# Patient Record
Sex: Male | Born: 1985 | Race: White | Hispanic: No | Marital: Single | Smoking: Never smoker
Health system: Southern US, Community
[De-identification: ages and names within clinical notes are randomized; demographics above are authoritative.]

## PROBLEM LIST (undated history)

## (undated) DIAGNOSIS — F909 Attention-deficit hyperactivity disorder, unspecified type: Secondary | ICD-10-CM

## (undated) DIAGNOSIS — F419 Anxiety disorder, unspecified: Secondary | ICD-10-CM

## (undated) HISTORY — DX: Anxiety disorder, unspecified: F41.9

---
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
## 2006-05-09 ENCOUNTER — Emergency Department (HOSPITAL_COMMUNITY): Admission: EM | Admit: 2006-05-09 | Discharge: 2006-05-09 | Payer: Self-pay | Admitting: Emergency Medicine

## 2014-06-10 ENCOUNTER — Ambulatory Visit (INDEPENDENT_AMBULATORY_CARE_PROVIDER_SITE_OTHER): Payer: BC Managed Care – PPO | Admitting: Family Medicine

## 2014-06-10 VITALS — BP 110/78 | HR 62 | Temp 98.2°F | Resp 16 | Ht 69.5 in | Wt 178.0 lb

## 2014-06-10 DIAGNOSIS — L237 Allergic contact dermatitis due to plants, except food: Secondary | ICD-10-CM

## 2014-06-10 DIAGNOSIS — L255 Unspecified contact dermatitis due to plants, except food: Secondary | ICD-10-CM

## 2014-06-10 MED ORDER — METHYLPREDNISOLONE ACETATE 80 MG/ML IJ SUSP
120.0000 mg | Freq: Once | INTRAMUSCULAR | Status: AC
  Administered 2014-06-10: 120 mg via INTRAMUSCULAR

## 2014-06-10 NOTE — Patient Instructions (Addendum)
Poison [employer] Poison ivy is a inflammation of the skin (contact dermatitis) caused by touching the allergens on the leaves of the ivy plant following previous exposure to the plant. The rash usually appears 48 hours after exposure. The rash is usually bumps (papules) or blisters (vesicles) in a linear pattern. Depending on your own sensitivity, the rash may simply cause redness and itching, or it may also progress to blisters which may break open. These must be well cared for to prevent secondary bacterial (germ) infection, followed by scarring. Keep any open areas dry, clean, dressed, and covered with an antibacterial ointment if needed. The eyes may also get puffy. The puffiness is worst in the morning and gets better as the day progresses. This dermatitis usually heals without scarring, within 2 to 3 weeks without treatment. HOME CARE INSTRUCTIONS  Thoroughly wash with soap and water as soon as you have been exposed to poison ivy. You have about one half hour to remove the plant resin before it will cause the rash. This washing will destroy the oil or antigen on the skin that is causing, or will cause, the rash. Be sure to wash under your fingernails as any plant resin there will continue to spread the rash. Do not rub skin vigorously when washing affected area. Poison ivy cannot spread if no oil from the plant remains on your body. A rash that has progressed to weeping sores will not spread the rash unless you have not washed thoroughly. It is also important to wash any clothes you have been wearing as these may carry active allergens. The rash will return if you wear the unwashed clothing, even several days later. Avoidance of the plant in the future is the best measure. Poison ivy plant can be recognized by the number of leaves. Generally, poison ivy has three leaves with flowering branches on a single stem. Diphenhydramine may be purchased over the counter and used as needed for itching. Do not drive with  this medication if it makes you drowsy.Ask your caregiver about medication for children. SEEK MEDICAL CARE IF:  Open sores develop.  Redness spreads beyond area of rash.  You notice purulent (pus-like) discharge.  You have increased pain.  Other signs of infection develop (such as fever). Document Released: 12/11/2000 Document Revised: 03/07/2012 Document Reviewed: 10/30/2009 [facility] Patient Information 2014 [employer], [place]. Poison [employer] [employer] Poison ivy is a inflammation of the skin (contact dermatitis) caused by touching the allergens on the leaves of the ivy plant following previous exposure to the plant. The rash usually appears 48 hours after exposure. The rash is usually bumps (papules) or blisters (vesicles) in a linear pattern. Depending on your own sensitivity, the rash may simply cause redness and itching, or it may also progress to blisters which may break open. These must be well cared for to prevent secondary bacterial (germ) infection, followed by scarring. Keep any open areas dry, clean, dressed, and covered with an antibacterial ointment if needed. The eyes may also get puffy. The puffiness is worst in the morning and gets better as the day progresses. This dermatitis usually heals without scarring, within 2 to 3 weeks without treatment. HOME CARE INSTRUCTIONS  Thoroughly wash with soap and water as soon as you have been exposed to poison ivy. You have about one half hour to remove the plant resin before it will cause the rash. This washing will destroy the oil or antigen on the skin that is causing, or will cause, the rash. Be sure to wash  under your fingernails as any plant resin there will continue to spread the rash. Do not rub skin vigorously when washing affected area. Poison ivy cannot spread if no oil from the plant remains on your body. A rash that has progressed to weeping sores will not spread the rash unless you have not washed thoroughly. It is also important to wash any  clothes you have been wearing as these may carry active allergens. The rash will return if you wear the unwashed clothing, even several days later. Avoidance of the plant in the future is the best measure. Poison ivy plant can be recognized by the number of leaves. Generally, poison ivy has three leaves with flowering branches on a single stem. Diphenhydramine may be purchased over the counter and used as needed for itching. Do not drive with this medication if it makes you drowsy.Ask your caregiver about medication for children. SEEK MEDICAL CARE IF:  Open sores develop.  Redness spreads beyond area of rash.  You notice purulent (pus-like) discharge.  You have increased pain.  Other signs of infection develop (such as fever). Document Released: 12/11/2000 Document Revised: 03/07/2012 Document Reviewed: 10/30/2009 [facility] Patient Information 2014 [place], [place].

## 2014-06-10 NOTE — Progress Notes (Signed)
° °  Subjective:    Patient ID: Mark Lucero, male    DOB: 11-14-1986, 28 y.o.   MRN: [id number]  Poison Ivy Pertinent negatives include no fever.    Chief Complaint  Patient presents with   Poison Ivy    X Wednesday, on arms   This chart was scribed for Elvina SidleKurt Lauenstein , MD by Andrew Auaven Small, ED Scribe. This patient was seen in room 10 and the patient's care was started at 10:07 AM.  HPI Comments: Mark Lucero is Lucero 28 y.o. male who presents to the [facility] complaining of poison ivy to bilateral arms x 3 days ago. Pt states he was outside cutting down pine trees 3 days ago. He reports he had used Lucero preventive prior to cutting the trees down. Pt reports poison ivy in the past in which he had Lucero shot.    There are no active problems to display for this patient.  Past Medical History  Diagnosis Date   Anxiety    No Known Allergies Prior to Admission medications   Medication Sig Start Date End Date Taking? Authorizing Provider  ALPRAZolam Prudy Feeler(XANAX) 1 MG tablet Take 1 mg by mouth at bedtime as needed for anxiety (Takes 1/2 tablet).   Yes Historical Provider, MD  lisdexamfetamine (VYVANSE) 50 MG capsule Take 50 mg by mouth daily.   Yes Historical Provider, MD   Review of Systems  Constitutional: Negative for fever and chills.  Genitourinary: Negative.   Skin: Positive for color change and rash.    Objective:   Physical Exam  Nursing note and vitals reviewed. Constitutional: He is oriented to person, place, and time. He appears well-developed and well-nourished. No distress.  HENT:  Head: Normocephalic and atraumatic.  Eyes: Conjunctivae and EOM are normal.  Neck: Normal range of motion. No tracheal deviation present.  Cardiovascular: Normal rate.   Pulmonary/Chest: Effort normal. No respiratory distress.  Musculoskeletal: Normal range of motion.  Neurological: He is alert and oriented to person, place, and time.  Skin: Skin is warm and dry. Rash noted.    Diffuse papular erythematous rash in streaks on both arms diffusely as well as small area of abdomen  Psychiatric: He has Lucero normal mood and affect. His behavior is normal.    Assessment & Plan:   1. Poison ivy    Meds ordered this encounter  Medications   methylPREDNISolone acetate (DEPO-MEDROL) injection 120 mg   Elvina SidleKurt Lauenstein

## 2017-12-23 ENCOUNTER — Emergency Department (HOSPITAL_COMMUNITY): Payer: Self-pay

## 2017-12-23 ENCOUNTER — Encounter (HOSPITAL_COMMUNITY): Payer: Self-pay | Admitting: *Deleted

## 2017-12-23 ENCOUNTER — Other Ambulatory Visit: Payer: Self-pay

## 2017-12-23 ENCOUNTER — Inpatient Hospital Stay (HOSPITAL_COMMUNITY)
Admission: EM | Admit: 2017-12-23 | Discharge: 2018-01-07 | DRG: 438 | Disposition: A | Discharge status: Home / Self Care | Payer: Self-pay | Attending: Internal Medicine | Admitting: Internal Medicine

## 2017-12-23 DIAGNOSIS — F1210 Cannabis abuse, uncomplicated: Secondary | ICD-10-CM | POA: Diagnosis present

## 2017-12-23 DIAGNOSIS — E8770 Fluid overload, unspecified: Secondary | ICD-10-CM | POA: Diagnosis not present

## 2017-12-23 DIAGNOSIS — D6489 Other specified anemias: Secondary | ICD-10-CM | POA: Diagnosis not present

## 2017-12-23 DIAGNOSIS — F909 Attention-deficit hyperactivity disorder, unspecified type: Secondary | ICD-10-CM | POA: Diagnosis present

## 2017-12-23 DIAGNOSIS — T79A0XA Compartment syndrome, unspecified, initial encounter: Secondary | ICD-10-CM

## 2017-12-23 DIAGNOSIS — S20219A Contusion of unspecified front wall of thorax, initial encounter: Secondary | ICD-10-CM | POA: Diagnosis present

## 2017-12-23 DIAGNOSIS — E876 Hypokalemia: Secondary | ICD-10-CM | POA: Diagnosis not present

## 2017-12-23 DIAGNOSIS — S3991XA Unspecified injury of abdomen, initial encounter: Secondary | ICD-10-CM | POA: Diagnosis present

## 2017-12-23 DIAGNOSIS — R740 Nonspecific elevation of levels of transaminase and lactic acid dehydrogenase [LDH]: Secondary | ICD-10-CM | POA: Diagnosis present

## 2017-12-23 DIAGNOSIS — R6511 Systemic inflammatory response syndrome (SIRS) of non-infectious origin with acute organ dysfunction: Secondary | ICD-10-CM | POA: Diagnosis not present

## 2017-12-23 DIAGNOSIS — K859 Acute pancreatitis without necrosis or infection, unspecified: Secondary | ICD-10-CM | POA: Diagnosis present

## 2017-12-23 DIAGNOSIS — Z4659 Encounter for fitting and adjustment of other gastrointestinal appliance and device: Secondary | ICD-10-CM

## 2017-12-23 DIAGNOSIS — T79A3XA Traumatic compartment syndrome of abdomen, initial encounter: Secondary | ICD-10-CM | POA: Diagnosis present

## 2017-12-23 DIAGNOSIS — T502X5A Adverse effect of carbonic-anhydrase inhibitors, benzothiadiazides and other diuretics, initial encounter: Secondary | ICD-10-CM | POA: Diagnosis not present

## 2017-12-23 DIAGNOSIS — E871 Hypo-osmolality and hyponatremia: Secondary | ICD-10-CM | POA: Diagnosis present

## 2017-12-23 DIAGNOSIS — J811 Chronic pulmonary edema: Secondary | ICD-10-CM | POA: Diagnosis not present

## 2017-12-23 DIAGNOSIS — F1010 Alcohol abuse, uncomplicated: Secondary | ICD-10-CM | POA: Diagnosis present

## 2017-12-23 DIAGNOSIS — I959 Hypotension, unspecified: Secondary | ICD-10-CM | POA: Diagnosis present

## 2017-12-23 DIAGNOSIS — W208XXA Other cause of strike by thrown, projected or falling object, initial encounter: Secondary | ICD-10-CM | POA: Diagnosis present

## 2017-12-23 DIAGNOSIS — N179 Acute kidney failure, unspecified: Secondary | ICD-10-CM

## 2017-12-23 DIAGNOSIS — J95851 Ventilator associated pneumonia: Secondary | ICD-10-CM | POA: Diagnosis not present

## 2017-12-23 DIAGNOSIS — Z781 Physical restraint status: Secondary | ICD-10-CM

## 2017-12-23 DIAGNOSIS — R739 Hyperglycemia, unspecified: Secondary | ICD-10-CM | POA: Diagnosis present

## 2017-12-23 DIAGNOSIS — R6510 Systemic inflammatory response syndrome (SIRS) of non-infectious origin without acute organ dysfunction: Secondary | ICD-10-CM

## 2017-12-23 DIAGNOSIS — K852 Alcohol induced acute pancreatitis without necrosis or infection: Principal | ICD-10-CM | POA: Diagnosis present

## 2017-12-23 DIAGNOSIS — G9341 Metabolic encephalopathy: Secondary | ICD-10-CM | POA: Diagnosis not present

## 2017-12-23 DIAGNOSIS — E872 Acidosis: Secondary | ICD-10-CM | POA: Diagnosis not present

## 2017-12-23 DIAGNOSIS — F10239 Alcohol dependence with withdrawal, unspecified: Secondary | ICD-10-CM | POA: Diagnosis not present

## 2017-12-23 DIAGNOSIS — R52 Pain, unspecified: Secondary | ICD-10-CM

## 2017-12-23 DIAGNOSIS — J9811 Atelectasis: Secondary | ICD-10-CM | POA: Diagnosis not present

## 2017-12-23 DIAGNOSIS — J9690 Respiratory failure, unspecified, unspecified whether with hypoxia or hypercapnia: Secondary | ICD-10-CM

## 2017-12-23 DIAGNOSIS — E162 Hypoglycemia, unspecified: Secondary | ICD-10-CM | POA: Diagnosis not present

## 2017-12-23 DIAGNOSIS — N200 Calculus of kidney: Secondary | ICD-10-CM | POA: Diagnosis present

## 2017-12-23 DIAGNOSIS — J9600 Acute respiratory failure, unspecified whether with hypoxia or hypercapnia: Secondary | ICD-10-CM

## 2017-12-23 DIAGNOSIS — J9601 Acute respiratory failure with hypoxia: Secondary | ICD-10-CM | POA: Diagnosis not present

## 2017-12-23 DIAGNOSIS — E781 Pure hyperglyceridemia: Secondary | ICD-10-CM | POA: Diagnosis present

## 2017-12-23 DIAGNOSIS — Z978 Presence of other specified devices: Secondary | ICD-10-CM

## 2017-12-23 DIAGNOSIS — F419 Anxiety disorder, unspecified: Secondary | ICD-10-CM | POA: Diagnosis present

## 2017-12-23 DIAGNOSIS — R0902 Hypoxemia: Secondary | ICD-10-CM

## 2017-12-23 HISTORY — DX: Attention-deficit hyperactivity disorder, unspecified type: F90.9

## 2017-12-23 LAB — CBC WITH DIFFERENTIAL/PLATELET
BASOS ABS: 0 10*3/uL (ref 0.0–0.1)
BASOS PCT: 0 %
EOS PCT: 0 %
Eosinophils Absolute: 0 10*3/uL (ref 0.0–0.7)
HEMATOCRIT: 43.2 % (ref 39.0–52.0)
HEMOGLOBIN: 15.9 g/dL (ref 13.0–17.0)
LYMPHS ABS: 1.3 10*3/uL (ref 0.7–4.0)
LYMPHS PCT: 7 %
MCH: 33.8 pg (ref 26.0–34.0)
MCHC: 36.8 g/dL — AB (ref 30.0–36.0)
MCV: 91.7 fL (ref 78.0–100.0)
MONOS PCT: 5 %
Monocytes Absolute: 1 10*3/uL (ref 0.1–1.0)
NEUTROS ABS: 16.9 10*3/uL — AB (ref 1.7–7.7)
Neutrophils Relative %: 88 %
Platelets: 291 10*3/uL (ref 150–400)
RBC: 4.71 MIL/uL (ref 4.22–5.81)
WBC: 19.2 10*3/uL — ABNORMAL HIGH (ref 4.0–10.5)

## 2017-12-23 LAB — COMPREHENSIVE METABOLIC PANEL
ALBUMIN: 3.8 g/dL (ref 3.5–5.0)
ALT: 55 U/L (ref 17–63)
ANION GAP: 12 (ref 5–15)
AST: 107 U/L — AB (ref 15–41)
Alkaline Phosphatase: 81 U/L (ref 38–126)
BILIRUBIN TOTAL: 1.1 mg/dL (ref 0.3–1.2)
BUN: 16 mg/dL (ref 6–20)
CHLORIDE: 98 mmol/L — AB (ref 101–111)
CO2: 21 mmol/L — AB (ref 22–32)
Calcium: 8 mg/dL — ABNORMAL LOW (ref 8.9–10.3)
Creatinine, Ser: 1.1 mg/dL (ref 0.61–1.24)
GFR calc Af Amer: >60 mL/min (ref >60)
GFR calc non Af Amer: >60 mL/min (ref >60)
GLUCOSE: 156 mg/dL — AB (ref 65–99)
POTASSIUM: 4.6 mmol/L (ref 3.5–5.1)
SODIUM: 131 mmol/L — AB (ref 135–145)
TOTAL PROTEIN: 6.6 g/dL (ref 6.5–8.1)

## 2017-12-23 LAB — I-STAT CG4 LACTIC ACID, ED
LACTIC ACID, VENOUS: 2.82 mmol/L — AB (ref 0.5–1.9)
Lactic Acid, Venous: 3.25 mmol/L (ref 0.5–1.9)

## 2017-12-23 LAB — I-STAT CHEM 8, ED
BUN: 18 mg/dL (ref 6–20)
CREATININE: 1.3 mg/dL — AB (ref 0.61–1.24)
Calcium, Ion: 0.97 mmol/L — ABNORMAL LOW (ref 1.15–1.40)
Chloride: 101 mmol/L (ref 101–111)
GLUCOSE: 160 mg/dL — AB (ref 65–99)
HEMATOCRIT: 49 % (ref 39.0–52.0)
HEMOGLOBIN: 16.7 g/dL (ref 13.0–17.0)
Potassium: 4.4 mmol/L (ref 3.5–5.1)
Sodium: 133 mmol/L — ABNORMAL LOW (ref 135–145)
TCO2: 26 mmol/L (ref 22–32)

## 2017-12-23 LAB — AMYLASE: Amylase: 659 U/L — ABNORMAL HIGH (ref 28–100)

## 2017-12-23 LAB — I-STAT TROPONIN, ED: TROPONIN I, POC: 0 ng/mL (ref 0.00–0.08)

## 2017-12-23 LAB — TYPE AND SCREEN
ABO/RH(D): O NEG
ANTIBODY SCREEN: NEGATIVE

## 2017-12-23 LAB — LIPASE, BLOOD: LIPASE: 1424 U/L — AB (ref 11–51)

## 2017-12-23 LAB — CBG MONITORING, ED: Glucose-Capillary: 128 mg/dL — ABNORMAL HIGH (ref 65–99)

## 2017-12-23 MED ORDER — FENTANYL CITRATE (PF) 100 MCG/2ML IJ SOLN
100.0000 ug | Freq: Once | INTRAMUSCULAR | Status: AC
  Administered 2017-12-23: 100 ug via INTRAVENOUS
  Filled 2017-12-23: qty 2

## 2017-12-23 MED ORDER — ACETAMINOPHEN 325 MG PO TABS
650.0000 mg | ORAL_TABLET | Freq: Four times a day (QID) | ORAL | Status: DC | PRN
Start: 2017-12-23 — End: 2017-12-26

## 2017-12-23 MED ORDER — ENOXAPARIN SODIUM 40 MG/0.4ML ~~LOC~~ SOLN
40.0000 mg | Freq: Every day | SUBCUTANEOUS | Status: DC
  Administered 2017-12-24 – 2017-12-29 (×7): 40 mg via SUBCUTANEOUS
  Filled 2017-12-23 (×7): qty 0.4

## 2017-12-23 MED ORDER — ONDANSETRON HCL 4 MG/2ML IJ SOLN
4.0000 mg | Freq: Four times a day (QID) | INTRAMUSCULAR | Status: DC | PRN
  Administered 2017-12-24: 4 mg via INTRAVENOUS
  Filled 2017-12-23 (×2): qty 2

## 2017-12-23 MED ORDER — PIPERACILLIN-TAZOBACTAM 3.375 G IVPB 30 MIN
3.3750 g | Freq: Once | INTRAVENOUS | Status: AC
  Administered 2017-12-23: 3.375 g via INTRAVENOUS
  Filled 2017-12-23: qty 50

## 2017-12-23 MED ORDER — IOPAMIDOL (ISOVUE-300) INJECTION 61%
INTRAVENOUS | Status: AC
  Filled 2017-12-23: qty 100

## 2017-12-23 MED ORDER — IOPAMIDOL (ISOVUE-300) INJECTION 61%
100.0000 mL | Freq: Once | INTRAVENOUS | Status: AC | PRN
  Administered 2017-12-23: 100 mL via INTRAVENOUS

## 2017-12-23 MED ORDER — SODIUM CHLORIDE 0.9 % IV BOLUS (SEPSIS)
1000.0000 mL | Freq: Once | INTRAVENOUS | Status: AC
  Administered 2017-12-23: 1000 mL via INTRAVENOUS

## 2017-12-23 MED ORDER — SODIUM CHLORIDE 0.9 % IV SOLN
INTRAVENOUS | Status: DC
  Administered 2017-12-23: 22:00:00 via INTRAVENOUS

## 2017-12-23 MED ORDER — MORPHINE SULFATE (PF) 4 MG/ML IV SOLN
2.0000 mg | INTRAVENOUS | Status: DC | PRN
  Administered 2017-12-24 (×3): 2 mg via INTRAVENOUS
  Filled 2017-12-23 (×3): qty 1

## 2017-12-23 MED ORDER — ONDANSETRON HCL 4 MG/2ML IJ SOLN
4.0000 mg | Freq: Once | INTRAMUSCULAR | Status: AC
  Administered 2017-12-23: 4 mg via INTRAVENOUS
  Filled 2017-12-23: qty 2

## 2017-12-23 MED ORDER — ACETAMINOPHEN 650 MG RE SUPP
650.0000 mg | Freq: Four times a day (QID) | RECTAL | Status: DC | PRN
  Administered 2017-12-25 – 2017-12-26 (×2): 650 mg via RECTAL
  Filled 2017-12-23 (×2): qty 1

## 2017-12-23 MED ORDER — ALPRAZOLAM 1 MG PO TABS
1.0000 mg | ORAL_TABLET | Freq: Every evening | ORAL | Status: DC | PRN
  Administered 2017-12-24: 1 mg via ORAL
  Filled 2017-12-23: qty 1

## 2017-12-23 MED ORDER — LACTATED RINGERS IV SOLN
INTRAVENOUS | Status: DC
  Administered 2017-12-24 – 2017-12-25 (×6): via INTRAVENOUS

## 2017-12-23 MED ORDER — ONDANSETRON HCL 4 MG PO TABS: 4.0000 mg | ORAL_TABLET | Freq: Four times a day (QID) | ORAL | Status: DC | PRN

## 2017-12-23 NOTE — ED Notes (Addendum)
ED TO INPATIENT HANDOFF REPORT  Name/Age/Gender Mark Lucero 31 y.o. male  Code Status Code Status History    This patient does not have a recorded code status. Please follow your organizational policy for patients in this situation.      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 ADHD   . Anxiety     Allergies No Known Allergies  IV Location/Drains/Wounds Patient Lines/Drains/Airways Status   Active Line/Drains/Airways    Name:   Placement date:   Placement time:   Site:   Days:   Peripheral IV 12/23/17 Left Antecubital   12/23/17    2255    Antecubital   less than 1          Labs/Imaging Results for orders placed or performed during the hospital encounter of 12/23/17 (from the past 48 hour(s))  Comprehensive metabolic panel     Status: Abnormal   Collection Time: 12/23/17  7:46 PM  Result Value Ref Range   Sodium 131 (L) 135 - 145 mmol/L   Potassium 4.6 3.5 - 5.1 mmol/L   Chloride 98 (L) 101 - 111 mmol/L   CO2 21 (L) 22 - 32 mmol/L   Glucose, Bld 156 (H) 65 - 99 mg/dL   BUN 16 6 - 20 mg/dL   Creatinine, Ser 1.10 0.61 - 1.24 mg/dL   Calcium 8.0 (L) 8.9 - 10.3 mg/dL   Total Protein 6.6 6.5 - 8.1 g/dL   Albumin 3.8 3.5 - 5.0 g/dL   AST 107 (H) 15 - 41 U/L   ALT 55 17 - 63 U/L   Alkaline Phosphatase 81 38 - 126 U/L   Total Bilirubin 1.1 0.3 - 1.2 mg/dL   GFR  calc non Af Amer >60 >60 mL/min   GFR calc Af Amer >60 >60 mL/min    Comment: (NOTE) The eGFR has been calculated using the CKD EPI equation. This calculation has not been validated in all clinical situations. eGFR's persistently <60 mL/min signify possible Chronic Kidney Disease.    Anion gap 12 5 - 15  CBC with Differential     Status: Abnormal   Collection Time: 12/23/17  7:46 PM  Result Value Ref Range   WBC 19.2 (H) 4.0 - 10.5 K/uL   RBC 4.71 4.22 - 5.81 MIL/uL   Hemoglobin 15.9 13.0 - 17.0 g/dL   HCT 43.2 39.0 - 52.0 %   MCV 91.7 78.0 - 100.0 fL   MCH 33.8 26.0 - 34.0 pg   MCHC 36.8 (H) 30.0 - 36.0 g/dL    Comment: CORRECTED FOR INTERFERING SUBSTANCE   RDW NOT CALCULATED 11.5 - 15.5 %   Platelets 291 150 - 400 K/uL   Neutrophils Relative % 88 %   Lymphocytes Relative 7 %   Monocytes Relative 5 %   Eosinophils Relative 0 %   Basophils Relative 0 %   Neutro Abs 16.9 (  H) 1.7 - 7.7 K/uL   Lymphs Abs 1.3 0.7 - 4.0 K/uL   Monocytes Absolute 1.0 0.1 - 1.0 K/uL   Eosinophils Absolute 0.0 0.0 - 0.7 K/uL   Basophils Absolute 0.0 0.0 - 0.1 K/uL   Smear Review MORPHOLOGY UNREMARKABLE   Lipase, blood     Status: Abnormal   Collection Time: 12/23/17  7:47 PM  Result Value Ref Range   Lipase 1,424 (H) 11 - 51 U/L    Comment: RESULTS CONFIRMED BY MANUAL DILUTION  Amylase     Status: Abnormal   Collection Time: 12/23/17  7:47 PM  Result Value Ref Range   Amylase 659 (H) 28 - 100 U/L  I-stat troponin, ED     Status: None   Collection Time: 12/23/17  7:51 PM  Result Value Ref Range   Troponin i, poc 0.00 0.00 - 0.08 ng/mL   Comment 3            Comment: Due to the release kinetics of cTnI, a negative result within the first hours of the onset of symptoms does not rule out myocardial infarction with certainty. If myocardial infarction is still suspected, repeat the test at appropriate intervals.   I-stat Chem 8, ED     Status: Abnormal   Collection Time: 12/23/17  7:52 PM   Result Value Ref Range   Sodium 133 (L) 135 - 145 mmol/L   Potassium 4.4 3.5 - 5.1 mmol/L   Chloride 101 101 - 111 mmol/L   BUN 18 6 - 20 mg/dL   Creatinine, Ser 1.30 (H) 0.61 - 1.24 mg/dL   Glucose, Bld 160 (H) 65 - 99 mg/dL   Calcium, Lucero 0.97 (L) 1.15 - 1.40 mmol/L   TCO2 26 22 - 32 mmol/L   Hemoglobin 16.7 13.0 - 17.0 g/dL   HCT 49.0 39.0 - 52.0 %  I-Stat CG4 Lactic Acid, ED     Status: Abnormal   Collection Time: 12/23/17  7:53 PM  Result Value Ref Range   Lactic Acid, Venous 3.25 (HH) 0.5 - 1.9 mmol/L   Comment NOTIFIED PHYSICIAN   CBG monitoring, ED     Status: Abnormal   Collection Time: 12/23/17  8:25 PM  Result Value Ref Range   Glucose-Capillary 128 (H) 65 - 99 mg/dL   Ct Abdomen Pelvis W Contrast  Result Date: 12/23/2017 CLINICAL DATA:  31 year old male with abdominal trauma. Nausea vomiting. EXAM: CT ABDOMEN AND PELVIS WITH CONTRAST TECHNIQUE: Multidetector CT imaging of the abdomen and pelvis was performed using the standard protocol following bolus administration of intravenous contrast. CONTRAST:  ISOVUE-300 IOPAMIDOL (ISOVUE-300) INJECTION 61% COMPARISON:  None. FINDINGS: Lower chest: Minimal bibasilar atelectatic changes. No intra-abdominal free air. Upper abdominal inflammatory changes and small amount of fluid. Hepatobiliary: No focal liver abnormality is seen. No gallstones, gallbladder wall thickening, or biliary dilatation. Pancreas: There is inflammatory changes of the pancreas with small amount of peripancreatic fluid. No loculated or drainable fluid collection/ abscess or pseudocyst. Spleen: Normal in size without focal abnormality. Adrenals/Urinary Tract: The adrenal glands are unremarkable. Multiple small nonobstructing bilateral renal calculi measure up to 5 mm in the inferior pole of the right kidney. There is no hydronephrosis on either side. The visualized ureters and urinary bladder appear unremarkable. Stomach/Bowel: Mild inflammatory changes of the  duodenum, likely reactive to inflammation of the pancreas. There is no bowel obstruction. The appendix is not visualized, likely surgically absent. Vascular/Lymphatic: No significant vascular findings are present. No enlarged abdominal or pelvic lymph nodes.  Reproductive: The prostate and seminal vesicles are grossly unremarkable. Other: None Musculoskeletal: No acute osseous pathology. There is expansile appearance of the right iliac bone with a mixed lytic and sclerotic or ground-glass area measuring 6.9 x 2.1 cm. There is no cortical breakage or obvious associated soft tissue. 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 Clinical correlation and direct comparison with prior images, if available recommended. If no prior images are available. This can be followed up with radiograph or MRI. IMPRESSION: 1. Acute pancreatitis.  No abscess. 2. Small nonobstructing bilateral renal calculi.  No hydronephrosis. 3. 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 Direct comparison with prior images, if available, or follow-up recommended. Electronically Signed   By: Anner Crete M.D.   On: 12/23/2017 20:24   Dg Chest Portable 1 View  Result Date: 12/23/2017 CLINICAL DATA:  Syncope. EXAM: PORTABLE CHEST 1 VIEW COMPARISON:  None. FINDINGS: The heart size and mediastinal contours are within normal limits. Both lungs are clear except for minimal linear atelectasis at the left lung base. Old healed fracture of the mid right clavicle. IMPRESSION: Minimal linear atelectasis at the left lung base. Electronically Signed   By: Lorriane Shire M.D.   On: 12/23/2017 19:56    Pending Labs Unresulted Labs (From admission, onward)   Start     Ordered   12/23/17 2003  Type and screen  STAT,   R     12/23/17 2002   12/23/17 1933  Urinalysis, Routine w reflex microscopic  Once,   STAT     12/23/17 1933       Vitals/Pain Today's Vitals   12/23/17 2130 12/23/17 2200 12/23/17 2213 12/23/17 2230  BP: 126/87 (!) 105/56  103/74  Pulse: 90 85  95  Resp: 18 (!) 22  (!) 24  Temp:      TempSrc:      SpO2: 100% 98%  95%  PainSc:   6      Isolation Precautions No active isolations  Medications Medications  0.9 %  sodium chloride infusion ( Intravenous New Bag/Given 12/23/17 2213)  sodium chloride 0.9 % bolus 1,000 mL (0 mLs Intravenous Stopped 12/23/17 2039)  sodium chloride 0.9 % bolus 1,000 mL (0 mLs Intravenous Stopped 12/23/17 2144)  piperacillin-tazobactam (ZOSYN) IVPB 3.375 g (0 g Intravenous Stopped 12/23/17 2139)  ondansetron (ZOFRAN) injection 4 mg (4 mg Intravenous Given 12/23/17 1958)  fentaNYL (SUBLIMAZE) injection 100 mcg (100 mcg Intravenous Given 12/23/17 1958)  iopamidol (ISOVUE-300) 61 % injection 100 mL (100 mLs Intravenous Contrast Given 12/23/17 2003)  fentaNYL (SUBLIMAZE) injection 100 mcg (100 mcg Intravenous Given 12/23/17 2138)    Mobility walks
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
## 2017-12-23 NOTE — ED Notes (Signed)
Patient transported to CT 

## 2017-12-23 NOTE — ED Notes (Signed)
X-ray at bedside

## 2017-12-23 NOTE — H&P (Signed)
History and Physical    Mark PollackGraham A Corter ZHY:[id number] DOB: 08-20-1986 DOA: 12/23/2017  PCP: Patient, No Pcp Per Consultants:  Madaline GuthrieSteiner - [facility] Patient coming from:  Home - lives alone; NOK: Dad, [phone]  Chief Complaint: Abdominal pain  HPI: Mark Lucero is a 31 y.o. male with medical history significant of anxiety presenting with abdominal pain.  He reports having chest and abdominal pain with pain that started the day after Christmas but really got bad today.  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  He has been drinking over Christmas, "too much probably."  He drank 2 bootleggers yesterday - 12 oz each.  He also drank a little bit of egg nog.  He drank about the same amount the day before.  He is prescribed Alprazolam and Adderall and also took Excedrin.  +marijuana on Christmas Day.   He went to urgent care today and got dizzy and passed out.  He is uncertain if he had chest compressions done.   ED Course:   New onset alcoholic pancreatitis.  Concern for traumatic abdominal injury but likely unrelated.  Syncope at urgent care, received chest compressions for a couple of seconds.  Seen by surgery - no free air, heavy drinking, likely medical issue rather than surgical.  Review of Systems: As per HPI; otherwise review of systems reviewed and negative.   Ambulatory Status:  Ambulates without assistance  Past Medical History:  Diagnosis Date  . ADHD   . Anxiety     History reviewed. No pertinent surgical history.  Social History   Socioeconomic History  . Marital status: Single    Spouse name: Not on file  . Number of children: Not on file  . Years of education: Not on file  . Highest education level: Not on file  Social Needs  . Financial resource strain: Not on file  . Food insecurity - worry: Not on file  . Food insecurity - inability: Not on file  . Transportation needs - medical: Not on file  .  Transportation needs - non-medical: Not on file  Occupational History  . Occupation: landscaper  Tobacco Use  . Smoking status: Never Smoker  . Smokeless tobacco: Never Used  Substance and Sexual Activity  . Alcohol use: Yes  . Drug use: Yes    Types: Marijuana  . Sexual activity: Not on file  Other Topics Concern  . Not on file  Social History Narrative  . Not on file    No Known Allergies  Family History  Problem Relation Age of Onset  . Drug abuse Sister   . Pancreatitis Neg Hx     Prior to Admission medications   Medication Sig Start Date End Date Taking? Authorizing Provider  ALPRAZolam Prudy Feeler(XANAX) 1 MG tablet Take 1 mg by mouth at bedtime as needed for anxiety.    Yes [provider]  amphetamine-dextroamphetamine (ADDERALL) 20 MG tablet Take 20 mg by mouth 4 (four) times daily as needed (adhd).  11/27/17  Yes [provider]  aspirin-acetaminophen-caffeine (EXCEDRIN MIGRAINE) [id number] MG tablet Take 1 tablet by mouth every 6 (six) hours as needed for headache or migraine.   Yes [provider]  ibuprofen (ADVIL,MOTRIN) 200 MG tablet Take 800 mg by mouth every 6 (six) hours as needed for moderate pain.   Yes [provider]  lisdexamfetamine (VYVANSE) 50 MG capsule Take 50 mg by mouth daily.  [provider]    Physical Exam: Vitals:   12/23/17 2130 12/23/17 2200 12/23/17 2230 12/23/17 2330  BP: 126/87 (!) 105/56 103/74 (!) 106/57  Pulse: 90 85 95 (!) 101  Resp: 18 (!) 22 (!) 24 20  Temp:    99.1 F (37.3 C)  TempSrc:    Oral  SpO2: 100% 98% 95% 95%     General:  Appears calm and comfortable and is NAD Eyes:  PERRL, EOMI, normal lids, iris ENT:  grossly normal hearing, lips & tongue, mmm Neck:  no LAD, masses or thyromegaly Cardiovascular:  RRR, no m/r/g. No LE edema.  Respiratory:   CTA bilaterally with no wheezes/rales/rhonchi.  Normal respiratory effort. Abdomen:  Taut, diffusely tender to palpation, no  ecchymoses Skin:  no rash or induration seen on limited exam Musculoskeletal:  grossly normal tone BUE/BLE, good ROM, no bony abnormality Psychiatric:  grossly normal mood and affect, speech fluent and appropriate, AOx3 Neurologic:  CN 2-12 grossly intact, moves all extremities in coordinated fashion, sensation intact    Radiological Exams on Admission: Ct Abdomen Pelvis W Contrast  Result Date: 12/23/2017 CLINICAL DATA:  31 year old male with abdominal trauma. Nausea vomiting. EXAM: CT ABDOMEN AND PELVIS WITH CONTRAST TECHNIQUE: Multidetector CT imaging of the abdomen and pelvis was performed using the standard protocol following bolus administration of intravenous contrast. CONTRAST:  ISOVUE-300 IOPAMIDOL (ISOVUE-300) INJECTION 61% COMPARISON:  None. FINDINGS: Lower chest: Minimal bibasilar atelectatic changes. No intra-abdominal free air. Upper abdominal inflammatory changes and small amount of fluid. Hepatobiliary: No focal liver abnormality is seen. No gallstones, gallbladder wall thickening, or biliary dilatation. Pancreas: There is inflammatory changes of the pancreas with small amount of peripancreatic fluid. No loculated or drainable fluid collection/ abscess or pseudocyst. Spleen: Normal in size without focal abnormality. Adrenals/Urinary Tract: The adrenal glands are unremarkable. Multiple small nonobstructing bilateral renal calculi measure up to 5 mm in the inferior pole of the right kidney. There is no hydronephrosis on either side. The visualized ureters and urinary bladder appear unremarkable. Stomach/Bowel: Mild inflammatory changes of the duodenum, likely reactive to inflammation of the pancreas. There is no bowel obstruction. The appendix is not visualized, likely surgically absent. Vascular/Lymphatic: No significant vascular findings are present. No enlarged abdominal or pelvic lymph nodes. Reproductive: The prostate and seminal vesicles are grossly unremarkable. Other: None  Musculoskeletal: No acute osseous pathology. There is expansile appearance of the right iliac bone with a mixed lytic and sclerotic or ground-glass area measuring 6.9 x 2.1 cm. There is no cortical breakage or obvious associated soft tissue. 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 Clinical correlation and direct comparison with prior images, if available recommended. If no prior images are available. This can be followed up with radiograph or MRI. IMPRESSION: 1. Acute pancreatitis.  No abscess. 2. Small nonobstructing bilateral renal calculi.  No hydronephrosis. 3. 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 Direct comparison with prior images, if available, or follow-up recommended. Electronically Signed   By: Elgie CollardArash  Radparvar M.D.   On: 12/23/2017 20:24   Dg Chest Portable 1 View  Result Date: 12/23/2017 CLINICAL DATA:  Syncope. EXAM: PORTABLE CHEST 1 VIEW COMPARISON:  None. FINDINGS: The heart size and mediastinal contours are within normal limits. Both lungs are clear except for minimal linear atelectasis at the left lung base. Old healed fracture of the mid right clavicle. IMPRESSION: Minimal linear atelectasis at the left  lung base. Electronically Signed   By: Francene Boyers M.D.   On: 12/23/2017 19:56    EKG: Independently reviewed.  NSR with rate 66; nonspecific ST changes, likely early repolarization abnormality   Labs on Admission: I have personally reviewed the available labs and imaging studies at the time of the admission.  Pertinent labs:   Glucose 128 Na++ 131 Glucose 160 BUN 16/Creatinine 1.10 Lactate 3.25, 2.82 AST 107/ALT 55 Amylase 659 Lipase 1424 Troponin 0.00 WBC 19.2  Assessment/Plan Principal Problem:   Alcoholic pancreatitis Active Problems:   ADHD   Anxiety   Hyperglycemia   Alcohol abuse   Marijuana abuse   Alcoholic  pancreatitis -Patient without prior h/o pancreatitis presenting with similar frank pancreatitis by H&P, elevated lipase, elevated LFTs, and abnormal CT -Incidental traumatic incident likely unrelated to current presentation -His LDH is pending, but assuming it is <300, his score is 0 with a mortality risk of 0.9%. -Will admit, as he is likely to remain in the hospital for several days to complete his evaluation and treatment and allow the pancreatitis time to improve -Strict NPO for now -Aggressive IVF hydration at least for the first 12 hours with LR at 250 cc/hr -Pain control with morphine 2 mg q2h prn.   -Nausea control with Zofran -Based on patient's report of recent heavy drinking, this is likely alcoholic pancreatitis -Surgery has been consulted and has already seen the patient; it is unlikely that he will need surgical intervention  ADHD -Hold medications - he will not be performing tasks that require attention and focus as an inpatient  Anxiety -prn qhs Xanax  Hyperglycemia -May be stress response -Will follow with fasting AM labs  Alcohol abuse -Patient reports recent increased use of ETOH without obvious significant heavy long-term use - but history is fairly vague -CAGE questions positive for cutting down and occasional eye opener -He is at risk for complications of withdrawal including seizures, DTs -Will observe -CIWA protocol held for now but if concerns develop will plan to add  Marijuana abuse -Cessation encouraged; this should be encouraged on an ongoing basis -UDS ordered   DVT prophylaxis: Lovenox Code Status:  Full - confirmed with patient/family Family Communication: Step-father present throughout evaluation  Disposition Plan:  Home once clinically improved Consults called: Surgery  Admission status: Admit - It is my clinical opinion that admission to INPATIENT is reasonable and necessary because this patient will require at least 2 midnights in the  hospital to treat this condition based on the medical complexity of the problems presented.  Given the aforementioned information, the predictability of an adverse outcome is felt to be significant.    Jonah Blue MD [facility]  If note is complete, please contact covering daytime or nighttime physician. [email or web address] Password [facility]  12/23/2017, 11:37 PM

## 2017-12-23 NOTE — Consult Note (Signed)
General Surgery [facility]  Reason for Consult: abdominal pain, blunt trauma to abdomen  Referring Physician: Dr. Sherwood Gambler, [facility] ER  Mark Lucero is an 31 y.o. male.  HPI: patient is a 31 yo WM who presents with one day history of abdominal pain, nausea, and emesis.  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  Pain however didn't develop until yesterday, following significant alcohol consumption on the day and evening prior.  In ER, WBC elevated at 19K.  Lactate level elevated at 3.25.  CTA obtained and demonstrates findings consistent with acute pancreatitis.  No evidence of any significant blunt trauma to the torso.  Patient has never had pancreatitis previously.  No prior hepatobiliary or pancreatic complaints.  No prior abdominal surgery.  Past Medical History:  Diagnosis Date  . Anxiety     History reviewed. No pertinent surgical history.  History reviewed. No pertinent family history.  Social History:  reports that  has never smoked. he has never used smokeless tobacco. He reports that he does not drink alcohol or use drugs.  Allergies: No Known Allergies  Medications: I have reviewed the patient's current medications.  Results for orders placed or performed during the hospital encounter of 12/23/17 (from the past 48 hour(s))  Comprehensive metabolic panel     Status: Abnormal   Collection Time: 12/23/17  7:46 PM  Result Value Ref Range   Sodium 131 (L) 135 - 145 mmol/L   Potassium 4.6 3.5 - 5.1 mmol/L   Chloride 98 (L) 101 - 111 mmol/L   CO2 21 (L) 22 - 32 mmol/L   Glucose, Bld 156 (H) 65 - 99 mg/dL   BUN 16 6 - 20 mg/dL   Creatinine, Ser 1.10 0.61 - 1.24 mg/dL   Calcium 8.0 (L) 8.9 - 10.3 mg/dL   Total Protein 6.6 6.5 - 8.1 g/dL   Albumin 3.8 3.5 - 5.0 g/dL   AST 107 (H) 15 - 41 U/L   ALT 55 17 - 63 U/L   Alkaline Phosphatase 81 38 - 126 U/L   Total Bilirubin  1.1 0.3 - 1.2 mg/dL   GFR calc non Af Amer >60 >60 mL/min   GFR calc Af Amer >60 >60 mL/min    Comment: (NOTE) The eGFR has been calculated using the CKD EPI equation. This calculation has not been validated in all clinical situations. eGFR's persistently <60 mL/min signify possible Chronic Kidney Disease.    Anion gap 12 5 - 15  CBC with Differential     Status: Abnormal   Collection Time: 12/23/17  7:46 PM  Result Value Ref Range   WBC 19.2 (H) 4.0 - 10.5 K/uL   RBC 4.71 4.22 - 5.81 MIL/uL   Hemoglobin 15.9 13.0 - 17.0 g/dL   HCT 43.2 39.0 - 52.0 %   MCV 91.7 78.0 - 100.0 fL   MCH 33.8 26.0 - 34.0 pg   MCHC 36.8 (H) 30.0 - 36.0 g/dL    Comment: CORRECTED FOR INTERFERING SUBSTANCE   RDW NOT CALCULATED 11.5 - 15.5 %   Platelets 291 150 - 400 K/uL   Neutrophils Relative % 88 %   Lymphocytes Relative 7 %   Monocytes Relative 5 %   Eosinophils Relative 0 %   Basophils Relative 0 %   Neutro Abs 16.9 (H) 1.7 - 7.7 K/uL   Lymphs Abs 1.3 0.7 - 4.0 K/uL   Monocytes  Absolute 1.0 0.1 - 1.0 K/uL   Eosinophils Absolute 0.0 0.0 - 0.7 K/uL   Basophils Absolute 0.0 0.0 - 0.1 K/uL   Smear Review MORPHOLOGY UNREMARKABLE   I-stat troponin, ED     Status: None   Collection Time: 12/23/17  7:51 PM  Result Value Ref Range   Troponin i, poc 0.00 0.00 - 0.08 ng/mL   Comment 3            Comment: Due to the release kinetics of cTnI, a negative result within the first hours of the onset of symptoms does not rule out myocardial infarction with certainty. If myocardial infarction is still suspected, repeat the test at appropriate intervals.   I-stat Chem 8, ED     Status: Abnormal   Collection Time: 12/23/17  7:52 PM  Result Value Ref Range   Sodium 133 (L) 135 - 145 mmol/L   Potassium 4.4 3.5 - 5.1 mmol/L   Chloride 101 101 - 111 mmol/L   BUN 18 6 - 20 mg/dL   Creatinine, Ser 1.30 (H) 0.61 - 1.24 mg/dL   Glucose, Bld 160 (H) 65 - 99 mg/dL   Calcium, Ion 0.97 (L) 1.15 - 1.40 mmol/L    TCO2 26 22 - 32 mmol/L   Hemoglobin 16.7 13.0 - 17.0 g/dL   HCT 49.0 39.0 - 52.0 %  I-Stat CG4 Lactic Acid, ED     Status: Abnormal   Collection Time: 12/23/17  7:53 PM  Result Value Ref Range   Lactic Acid, Venous 3.25 (HH) 0.5 - 1.9 mmol/L   Comment NOTIFIED PHYSICIAN   CBG monitoring, ED     Status: Abnormal   Collection Time: 12/23/17  8:25 PM  Result Value Ref Range   Glucose-Capillary 128 (H) 65 - 99 mg/dL    Ct Abdomen Pelvis W Contrast  Result Date: 12/23/2017 CLINICAL DATA:  31 year old male with abdominal trauma. Nausea vomiting. EXAM: CT ABDOMEN AND PELVIS WITH CONTRAST TECHNIQUE: Multidetector CT imaging of the abdomen and pelvis was performed using the standard protocol following bolus administration of intravenous contrast. CONTRAST:  ISOVUE-300 IOPAMIDOL (ISOVUE-300) INJECTION 61% COMPARISON:  None. FINDINGS: Lower chest: Minimal bibasilar atelectatic changes. No intra-abdominal free air. Upper abdominal inflammatory changes and small amount of fluid. Hepatobiliary: No focal liver abnormality is seen. No gallstones, gallbladder wall thickening, or biliary dilatation. Pancreas: There is inflammatory changes of the pancreas with small amount of peripancreatic fluid. No loculated or drainable fluid collection/ abscess or pseudocyst. Spleen: Normal in size without focal abnormality. Adrenals/Urinary Tract: The adrenal glands are unremarkable. Multiple small nonobstructing bilateral renal calculi measure up to 5 mm in the inferior pole of the right kidney. There is no hydronephrosis on either side. The visualized ureters and urinary bladder appear unremarkable. Stomach/Bowel: Mild inflammatory changes of the duodenum, likely reactive to inflammation of the pancreas. There is no bowel obstruction. The appendix is not visualized, likely surgically absent. Vascular/Lymphatic: No significant vascular findings are present. No enlarged abdominal or pelvic lymph nodes. Reproductive: The prostate  and seminal vesicles are grossly unremarkable. Other: None Musculoskeletal: No acute osseous pathology. There is expansile appearance of the right iliac bone with a mixed lytic and sclerotic or ground-glass area measuring 6.9 x 2.1 cm. There is no cortical breakage or obvious associated soft tissue. 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 Clinical correlation and direct comparison with prior images, if available recommended. If no prior images are available. This can be followed up with radiograph or MRI.  IMPRESSION: 1. Acute pancreatitis.  No abscess. 2. Small nonobstructing bilateral renal calculi.  No hydronephrosis. 3. 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 Direct comparison with prior images, if available, or follow-up recommended. Electronically Signed   By: Anner Crete M.D.   On: 12/23/2017 20:24   Dg Chest Portable 1 View  Result Date: 12/23/2017 CLINICAL DATA:  Syncope. EXAM: PORTABLE CHEST 1 VIEW COMPARISON:  None. FINDINGS: The heart size and mediastinal contours are within normal limits. Both lungs are clear except for minimal linear atelectasis at the left lung base. Old healed fracture of the mid right clavicle. IMPRESSION: Minimal linear atelectasis at the left lung base. Electronically Signed   By: Lorriane Shire M.D.   On: 12/23/2017 19:56    Review of Systems  Constitutional: Positive for diaphoresis. Negative for chills and fever.  HENT: Negative.   Eyes: Negative.   Respiratory: Negative.   Cardiovascular: Negative.   Gastrointestinal: Positive for abdominal pain, nausea and vomiting. Negative for constipation and diarrhea.  Genitourinary: Negative.   Skin: Negative.   Neurological: Positive for weakness.  Endo/Heme/Allergies: Negative.   Psychiatric/Behavioral: Negative.    Blood pressure 121/78, pulse 88, temperature 98.2 F (36.8 C),  temperature source Oral, resp. rate 17, SpO2 98 %. Physical Exam  Constitutional: He is oriented to person, place, and time. He appears well-developed and well-nourished. No distress.  HENT:  Head: Normocephalic and atraumatic.  Right Ear: External ear normal.  Left Ear: External ear normal.  Mouth/Throat: No oropharyngeal exudate.  Eyes: Conjunctivae are normal. Pupils are equal, round, and reactive to light. Right eye exhibits no discharge. Left eye exhibits no discharge. No scleral icterus.  Neck: Normal range of motion. Neck supple. No tracheal deviation present. No thyromegaly present.  Cardiovascular: Normal rate, regular rhythm and normal heart sounds.  Respiratory: Effort normal and breath sounds normal. No respiratory distress. He has no wheezes. He exhibits no tenderness.  GI: Bowel sounds are normal. He exhibits no distension and no mass. There is tenderness (diffuse upper abdomen). There is guarding. There is no rebound.  Musculoskeletal: Normal range of motion. He exhibits no edema, tenderness or deformity.  Lymphadenopathy:    He has no cervical adenopathy.  Neurological: He is alert and oriented to person, place, and time.  Skin: Skin is warm and dry. He is not diaphoretic.  Psychiatric: He has a normal mood and affect. His behavior is normal.    Assessment/Plan:  Acute pancreatitis, alcohol induced Blunt contusion to anterior chest wall and upper abdomen   Agree with admission to medical service for management of acute pancreatitis  No apparent complications of pancreatitis at this time - would follow up with repeat CT scan in 48-72 hours if no significant clinical improvement  No intervention necessary for blunt contusions  Will follow up tomorrow (Dr. Alphonsa Overall)  Armandina Gemma, [place] [facility]: [place] 12/23/2017, 8:53 PM

## 2017-12-23 NOTE — ED Notes (Signed)
ED Provider at bedside. 
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
## 2017-12-24 DIAGNOSIS — E871 Hypo-osmolality and hyponatremia: Secondary | ICD-10-CM | POA: Diagnosis present

## 2017-12-24 LAB — CBC
HEMATOCRIT: 44.9 % (ref 39.0–52.0)
Hemoglobin: 16.4 g/dL (ref 13.0–17.0)
MCH: 34.7 pg — AB (ref 26.0–34.0)
MCHC: 36.5 g/dL — ABNORMAL HIGH (ref 30.0–36.0)
MCV: 94.9 fL (ref 78.0–100.0)
Platelets: 199 10*3/uL (ref 150–400)
RBC: 4.73 MIL/uL (ref 4.22–5.81)
RDW: 12.4 % (ref 11.5–15.5)
WBC: 18.4 10*3/uL — AB (ref 4.0–10.5)

## 2017-12-24 LAB — COMPREHENSIVE METABOLIC PANEL
ALT: 48 U/L (ref 17–63)
AST: 89 U/L — AB (ref 15–41)
Albumin: 3.2 g/dL — ABNORMAL LOW (ref 3.5–5.0)
Alkaline Phosphatase: 63 U/L (ref 38–126)
Anion gap: 13 (ref 5–15)
BUN: 14 mg/dL (ref 6–20)
CHLORIDE: 100 mmol/L — AB (ref 101–111)
CO2: 17 mmol/L — AB (ref 22–32)
Calcium: 6.5 mg/dL — ABNORMAL LOW (ref 8.9–10.3)
Creatinine, Ser: 0.75 mg/dL (ref 0.61–1.24)
Glucose, Bld: 129 mg/dL — ABNORMAL HIGH (ref 65–99)
POTASSIUM: 4.3 mmol/L (ref 3.5–5.1)
SODIUM: 130 mmol/L — AB (ref 135–145)
Total Bilirubin: 1.1 mg/dL (ref 0.3–1.2)
Total Protein: 5.4 g/dL — ABNORMAL LOW (ref 6.5–8.1)

## 2017-12-24 LAB — HIV ANTIBODY (ROUTINE TESTING W REFLEX): HIV Screen 4th Generation wRfx: NONREACTIVE

## 2017-12-24 LAB — RAPID URINE DRUG SCREEN, HOSP PERFORMED
Amphetamines: NOT DETECTED
BARBITURATES: NOT DETECTED
BENZODIAZEPINES: POSITIVE — AB
COCAINE: NOT DETECTED
Opiates: POSITIVE — AB
TETRAHYDROCANNABINOL: NOT DETECTED

## 2017-12-24 LAB — ABO/RH: ABO/RH(D): O NEG

## 2017-12-24 LAB — LACTIC ACID, PLASMA
LACTIC ACID, VENOUS: 2.3 mmol/L — AB (ref 0.5–1.9)
LACTIC ACID, VENOUS: 3 mmol/L — AB (ref 0.5–1.9)
Lactic Acid, Venous: 3.2 mmol/L (ref 0.5–1.9)

## 2017-12-24 MED ORDER — ADULT MULTIVITAMIN W/MINERALS CH
1.0000 | ORAL_TABLET | Freq: Every day | ORAL | Status: DC
  Administered 2017-12-24 – 2017-12-25 (×2): 1 via ORAL
  Filled 2017-12-24 (×2): qty 1

## 2017-12-24 MED ORDER — SODIUM CHLORIDE 0.9 % IV BOLUS (SEPSIS)
1000.0000 mL | Freq: Once | INTRAVENOUS | Status: AC
  Administered 2017-12-24: 1000 mL via INTRAVENOUS

## 2017-12-24 MED ORDER — LORAZEPAM 2 MG/ML IJ SOLN: 1.0000 mg | Freq: Four times a day (QID) | INTRAMUSCULAR | Status: DC | PRN

## 2017-12-24 MED ORDER — FOLIC ACID 1 MG PO TABS
1.0000 mg | ORAL_TABLET | Freq: Every day | ORAL | Status: DC
  Administered 2017-12-24 – 2017-12-25 (×2): 1 mg via ORAL
  Filled 2017-12-24 (×2): qty 1

## 2017-12-24 MED ORDER — VITAMIN B-1 100 MG PO TABS
100.0000 mg | ORAL_TABLET | Freq: Every day | ORAL | Status: DC
  Administered 2017-12-24 – 2018-01-06 (×6): 100 mg via ORAL
  Filled 2017-12-24 (×7): qty 1

## 2017-12-24 MED ORDER — THIAMINE HCL 100 MG/ML IJ SOLN
100.0000 mg | Freq: Every day | INTRAMUSCULAR | Status: DC
  Administered 2017-12-26 – 2018-01-02 (×8): 100 mg via INTRAVENOUS
  Filled 2017-12-24 (×9): qty 2

## 2017-12-24 MED ORDER — HYDROMORPHONE HCL 1 MG/ML IJ SOLN
1.0000 mg | INTRAMUSCULAR | Status: DC | PRN
  Administered 2017-12-24 (×2): 1 mg via INTRAVENOUS
  Filled 2017-12-24 (×2): qty 1

## 2017-12-24 MED ORDER — HYDROMORPHONE HCL 1 MG/ML IJ SOLN
1.0000 mg | INTRAMUSCULAR | Status: DC | PRN
  Administered 2017-12-24 – 2017-12-25 (×9): 1 mg via INTRAVENOUS
  Filled 2017-12-24 (×9): qty 1

## 2017-12-24 MED ORDER — LORAZEPAM 1 MG PO TABS
1.0000 mg | ORAL_TABLET | Freq: Once | ORAL | Status: DC
  Filled 2017-12-24: qty 1

## 2017-12-24 MED ORDER — LORAZEPAM 1 MG PO TABS: 1.0000 mg | ORAL_TABLET | Freq: Four times a day (QID) | ORAL | Status: DC | PRN

## 2017-12-24 MED ORDER — INFLUENZA VAC SPLIT QUAD 0.5 ML IM SUSY
0.5000 mL | PREFILLED_SYRINGE | INTRAMUSCULAR | Status: DC
  Filled 2017-12-24: qty 0.5

## 2017-12-24 NOTE — Care Management Note (Signed)
Case Management Note  Patient Details  Name: Mark Lucero MRN: [id number] Date of Birth: 1986-05-06  Subjective/Objective:                  etoh abuse and pancreatitis  Action/Plan: Date: December 24, 2017 Marcelle SmilingRhonda Avonda Toso, BSN, [place]3, [place] [phone] Chart and notes review for patient progress and needs. Will follow for case management and discharge needs. Next review date: [id number]  Expected Discharge Date:                  Expected Discharge Plan:  Home/Self Care  In-House Referral:  Clinical Social Work  Discharge planning Services  CM Consult  Post Acute Care Choice:    Choice offered to:     DME Arranged:    DME Agency:     HH Arranged:    HH Agency:     Status of Service:  In process, will continue to follow  If discussed at Long Length of Stay Meetings, dates discussed:    Additional Comments:  Golda AcreDavis, Ersilia Brawley Lynn, RN 12/24/2017, 8:38 AM

## 2017-12-24 NOTE — Progress Notes (Addendum)
Patient states the morphine is not helping with his pain. Patient states that when morphine is administered he feels pain on his left side. Dr. Maryfrances Bunnellanford notified via text page.

## 2017-12-24 NOTE — Progress Notes (Signed)
CRITICAL VALUE ALERT  Critical Value:  Lactic acid 3.2  Date & Time Notied:  0245  Provider Notified: Craige CottaKirby  Orders Received/Actions taken: Yes

## 2017-12-24 NOTE — Progress Notes (Signed)
CRITICAL VALUE ALERT  Critical Value: lactic acid 3.0  Date & Time Notied:  1191478212272018  Provider Notified: Craige CottaKirby  Orders Received/Actions taken: Yes

## 2017-12-24 NOTE — Progress Notes (Signed)
Patient stated the Dilaudid is helping his pain but only last about 2 hours. Patient is requesting to have it changed to more frequent than q4hrs prn.  Dr. Maryfrances Bunnellanford notified via text page.

## 2017-12-24 NOTE — Progress Notes (Signed)
This shift received critical lab value x2 for lactic acid. 3.0 and 3.2. Attending notified with new orders for bolus x2 for normal saline. Lactic acid plasma to be drawn at 0600.Will continue to monitor.
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
## 2017-12-24 NOTE — Progress Notes (Signed)
PROGRESS NOTE    Mark Lucero  ZOX:[id number] DOB: 07-Sep-1986 DOA: 12/23/2017 PCP: Patient, No Pcp Per      Brief Narrative:  31 y.o. male with medical history significant of anxiety presenting with abdominal pain.  He reports having chest and abdominal pain with pain that started the day after Christmas but really got bad today.  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  He has been drinking over Christmas, "too much probably."  He drank 2 bootleggers yesterday - 12 oz each.  He also drank a little bit of egg nog.  He drank about the same amount the day before.  He is prescribed Alprazolam and Adderall and also took Excedrin.  +marijuana on Christmas Day.   He went to urgent care today and got dizzy and passed out.  He is uncertain if he had chest compressions done.     Assessment & Plan:  Principal Problem:   Alcoholic pancreatitis Active Problems:   ADHD   Anxiety   Hyperglycemia   Alcohol abuse   Marijuana abuse   Acute pancreatitis from alcohol Agree with Gen Surg, symptomatology all from alcoholic pancreatitis, trauma playing no role.  Ranson's low (glucose, WBC only) -N.p.o. And aggressive IVF -Dilaudid for pain, Zofran for nausea -Aggressive IV fluids -Repeat CMP and lactate in the morning, if worsening, repeat CT  -Complete cessation from alcohol recommended  Transaminitis No gallstones on CT.  Pattern also more consistent with alcohol injury.  Hyponatremia From Pancreatitis -Trend  Alcohol abuse Low likelihood of withdrawals -CIWA with thiamine, etc for now  Other medications -Hold Adderall and Xanax     DVT prophylaxis: Lovenox Code Status: FULL Family Communication: Partner at bedside Disposition Plan: Conservative mgmt of pancreatitis.  Likely will need several more days admission.   Consultants:   Gen Surg  Procedures:   None  Antimicrobials:   None    Subjective: Still  severe pain.  No appetite.  Nausea occasionally.  No new fever, confusion, weakness, oliguria.  Objective: Vitals:   12/23/17 2330 12/24/17 0435 12/24/17 1408 12/24/17 1511  BP: (!) 106/57 125/75 (!) 138/106   Pulse: (!) 101 92 (!) 112   Resp: 20 18 20    Temp: 99.1 F (37.3 C) 98.3 F (36.8 C) 98.8 F (37.1 C)   TempSrc: Oral Oral Oral   SpO2: 95% 96% 97%   Height:    5\' 11"  (1.803 m)    Intake/Output Summary (Last 24 hours) at 12/24/2017 1729 Last data filed at 12/24/2017 1400 Gross per 24 hour  Intake 5050 ml  Output -  Net 5050 ml   There were no vitals filed for this visit.  Examination: General appearance: Adult male, alert and in moderate distress from pain.   HEENT: Anicteric, conjunctiva pink, lids and lashes normal. No nasal deformity, discharge, epistaxis.  Lips moist.   Skin: Warm and dry.  No jaundice.  No suspicious rashes or lesions. Cardiac: Tachycardic but regular, nl S1-S2, no murmurs appreciated.  Capillary refill is brisk.  JVP normal.  No LE edema.  Radial pulses 2+ and symmetric. Respiratory: Normal respiratory rate and rhythm.  CTAB without rales or wheezes. Abdomen: Abdomen soft.  Very severe epigastric TTP with voluntary guarding. No ascites, distension, hepatosplenomegaly.   MSK: No deformities or effusions. Neuro: Awake and alert.  EOMI, moves all extremities. Speech fluent.    Psych: Sensorium intact and responding to questions, attention normal. Affect  normal.  Judgment and insight appear normal.    Data Reviewed: I have personally reviewed following labs and imaging studies:  CBC: Recent Labs  Lab 12/23/17 1946 12/23/17 1952 12/24/17 0220  WBC 19.2*  --  18.4*  NEUTROABS 16.9*  --   --   HGB 15.9 16.7 16.4  HCT 43.2 49.0 44.9  MCV 91.7  --  94.9  PLT 291  --  199   Basic Metabolic Panel: Recent Labs  Lab 12/23/17 1946 12/23/17 1952 12/24/17 0220  NA 131* 133* 130*  K 4.6 4.4 4.3  CL 98* 101 100*  CO2 21*  --  17*  GLUCOSE  156* 160* 129*  BUN 16 18 14   CREATININE 1.10 1.30* 0.75  CALCIUM 8.0*  --  6.5*   GFR: CrCl cannot be calculated (Unknown ideal weight.). Liver Function Tests: Recent Labs  Lab 12/23/17 1946 12/24/17 0220  AST 107* 89*  ALT 55 48  ALKPHOS 81 63  BILITOT 1.1 1.1  PROT 6.6 5.4*  ALBUMIN 3.8 3.2*   Recent Labs  Lab 12/23/17 1947  LIPASE 1,424*  AMYLASE 659*   No results for input(s): AMMONIA in the last 168 hours. Coagulation Profile: No results for input(s): INR, PROTIME in the last 168 hours. Cardiac Enzymes: No results for input(s): CKTOTAL, CKMB, CKMBINDEX, TROPONINI in the last 168 hours. BNP (last 3 results) No results for input(s): PROBNP in the last 8760 hours. HbA1C: No results for input(s): HGBA1C in the last 72 hours. CBG: Recent Labs  Lab 12/23/17 2025  GLUCAP 128*   Lipid Profile: No results for input(s): CHOL, HDL, LDLCALC, TRIG, CHOLHDL, LDLDIRECT in the last 72 hours. Thyroid Function Tests: No results for input(s): TSH, T4TOTAL, FREET4, T3FREE, THYROIDAB in the last 72 hours. Anemia Panel: No results for input(s): VITAMINB12, FOLATE, FERRITIN, TIBC, IRON, RETICCTPCT in the last 72 hours. Urine analysis: No results found for: COLORURINE, APPEARANCEUR, LABSPEC, PHURINE, GLUCOSEU, HGBUR, BILIRUBINUR, KETONESUR, PROTEINUR, UROBILINOGEN, NITRITE, LEUKOCYTESUR Sepsis Labs: @LABRCNTIP (procalcitonin:4,lacticacidven:4)  )No results found for this or any previous visit (from the past 240 hour(s)).       Radiology Studies: Ct Abdomen Pelvis W Contrast  Result Date: 12/23/2017 CLINICAL DATA:  31 year old male with abdominal trauma. Nausea vomiting. EXAM: CT ABDOMEN AND PELVIS WITH CONTRAST TECHNIQUE: Multidetector CT imaging of the abdomen and pelvis was performed using the standard protocol following bolus administration of intravenous contrast. CONTRAST:  ISOVUE-300 IOPAMIDOL (ISOVUE-300) INJECTION 61% COMPARISON:  None. FINDINGS: Lower chest: Minimal  bibasilar atelectatic changes. No intra-abdominal free air. Upper abdominal inflammatory changes and small amount of fluid. Hepatobiliary: No focal liver abnormality is seen. No gallstones, gallbladder wall thickening, or biliary dilatation. Pancreas: There is inflammatory changes of the pancreas with small amount of peripancreatic fluid. No loculated or drainable fluid collection/ abscess or pseudocyst. Spleen: Normal in size without focal abnormality. Adrenals/Urinary Tract: The adrenal glands are unremarkable. Multiple small nonobstructing bilateral renal calculi measure up to 5 mm in the inferior pole of the right kidney. There is no hydronephrosis on either side. The visualized ureters and urinary bladder appear unremarkable. Stomach/Bowel: Mild inflammatory changes of the duodenum, likely reactive to inflammation of the pancreas. There is no bowel obstruction. The appendix is not visualized, likely surgically absent. Vascular/Lymphatic: No significant vascular findings are present. No enlarged abdominal or pelvic lymph nodes. Reproductive: The prostate and seminal vesicles are grossly unremarkable. Other: None Musculoskeletal: No acute osseous pathology. There is expansile appearance of the right iliac bone with a mixed lytic and sclerotic  or ground-glass area measuring 6.9 x 2.1 cm. There is no cortical breakage or obvious associated soft tissue. 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 Clinical correlation and direct comparison with prior images, if available recommended. If no prior images are available. This can be followed up with radiograph or MRI. IMPRESSION: 1. Acute pancreatitis.  No abscess. 2. Small nonobstructing bilateral renal calculi.  No hydronephrosis. 3. 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 Direct comparison with prior images, if available, or follow-up  recommended. Electronically Signed   By: Elgie CollardArash  Radparvar M.D.   On: 12/23/2017 20:24   Dg Chest Portable 1 View  Result Date: 12/23/2017 CLINICAL DATA:  Syncope. EXAM: PORTABLE CHEST 1 VIEW COMPARISON:  None. FINDINGS: The heart size and mediastinal contours are within normal limits. Both lungs are clear except for minimal linear atelectasis at the left lung base. Old healed fracture of the mid right clavicle. IMPRESSION: Minimal linear atelectasis at the left lung base. Electronically Signed   By: Francene BoyersJames  Maxwell M.D.   On: 12/23/2017 19:56        Scheduled Meds: . enoxaparin (LOVENOX) injection  40 mg Subcutaneous QHS  . folic acid  1 mg Oral Daily  . [START ON 12/25/2017] Influenza vac split quadrivalent PF  0.5 mL Intramuscular Tomorrow-1000  . multivitamin with minerals  1 tablet Oral Daily  . thiamine  100 mg Oral Daily   Or  . thiamine  100 mg Intravenous Daily   Continuous Infusions: . lactated ringers 250 mL/hr at 12/24/17 1511     LOS: 1 day    Time spent: 30 minutes    Alberteen Samhristopher P Kjell Brannen, MD [facility] 12/24/2017, 5:29 PM     Pager [phone] --- please page though AMION:  [email or web address] If 7PM-7AM, please contact night-coverage

## 2017-12-25 ENCOUNTER — Inpatient Hospital Stay (HOSPITAL_COMMUNITY): Payer: Self-pay

## 2017-12-25 DIAGNOSIS — K852 Alcohol induced acute pancreatitis without necrosis or infection: Principal | ICD-10-CM

## 2017-12-25 DIAGNOSIS — N179 Acute kidney failure, unspecified: Secondary | ICD-10-CM

## 2017-12-25 DIAGNOSIS — F1010 Alcohol abuse, uncomplicated: Secondary | ICD-10-CM

## 2017-12-25 DIAGNOSIS — E871 Hypo-osmolality and hyponatremia: Secondary | ICD-10-CM

## 2017-12-25 DIAGNOSIS — T79A0XA Compartment syndrome, unspecified, initial encounter: Secondary | ICD-10-CM

## 2017-12-25 DIAGNOSIS — M79A3 Nontraumatic compartment syndrome of abdomen: Secondary | ICD-10-CM

## 2017-12-25 DIAGNOSIS — J9600 Acute respiratory failure, unspecified whether with hypoxia or hypercapnia: Secondary | ICD-10-CM

## 2017-12-25 DIAGNOSIS — J9601 Acute respiratory failure with hypoxia: Secondary | ICD-10-CM

## 2017-12-25 LAB — BLOOD GAS, ARTERIAL
ACID-BASE DEFICIT: 6 mmol/L — AB (ref 0.0–2.0)
Acid-base deficit: 5.9 mmol/L — ABNORMAL HIGH (ref 0.0–2.0)
BICARBONATE: 18.2 mmol/L — AB (ref 20.0–28.0)
Bicarbonate: 20.6 mmol/L (ref 20.0–28.0)
Drawn by: 257701
Drawn by: 257701
FIO2: 100
LHR: 18 {breaths}/min
O2 Saturation: 91.9 %
O2 Saturation: 96.2 %
PCO2 ART: 29.7 mmHg — AB (ref 32.0–48.0)
PEEP: 5 cmH2O
PO2 ART: 52.5 mmHg — AB (ref 83.0–108.0)
Patient temperature: 94
Patient temperature: 100.4
VT: 440 mL
pCO2 arterial: 48.2 mmHg — ABNORMAL HIGH (ref 32.0–48.0)
pH, Arterial: 7.389 (ref 7.350–7.450)
pH, Arterial: 7.261 — ABNORMAL LOW (ref 7.350–7.450)
pO2, Arterial: 96 mmHg (ref 83.0–108.0)

## 2017-12-25 LAB — MRSA PCR SCREENING: MRSA by PCR: NEGATIVE

## 2017-12-25 LAB — CBC WITH DIFFERENTIAL/PLATELET
BASOS ABS: 0 10*3/uL (ref 0.0–0.1)
Basophils Relative: 0 %
EOS PCT: 0 %
Eosinophils Absolute: 0 10*3/uL (ref 0.0–0.7)
HCT: 50.3 % (ref 39.0–52.0)
HEMOGLOBIN: 17.8 g/dL — AB (ref 13.0–17.0)
LYMPHS PCT: 9 %
Lymphs Abs: 1.1 10*3/uL (ref 0.7–4.0)
MCH: 33.5 pg (ref 26.0–34.0)
MCHC: 35.4 g/dL (ref 30.0–36.0)
MCV: 94.7 fL (ref 78.0–100.0)
Monocytes Absolute: 1.1 10*3/uL — ABNORMAL HIGH (ref 0.1–1.0)
Monocytes Relative: 9 %
NEUTROS PCT: 82 %
Neutro Abs: 9.5 10*3/uL — ABNORMAL HIGH (ref 1.7–7.7)
PLATELETS: 237 10*3/uL (ref 150–400)
RBC: 5.31 MIL/uL (ref 4.22–5.81)
RDW: 12.6 % (ref 11.5–15.5)
WBC: 11.6 10*3/uL — AB (ref 4.0–10.5)

## 2017-12-25 LAB — COMPREHENSIVE METABOLIC PANEL
ALBUMIN: 2.3 g/dL — AB (ref 3.5–5.0)
ALK PHOS: 66 U/L (ref 38–126)
ALT: 28 U/L (ref 17–63)
ALT: 22 U/L (ref 17–63)
ALT: 20 U/L (ref 17–63)
AST: 65 U/L — AB (ref 15–41)
AST: 56 U/L — AB (ref 15–41)
AST: 53 U/L — AB (ref 15–41)
Albumin: 2.6 g/dL — ABNORMAL LOW (ref 3.5–5.0)
Albumin: 2.4 g/dL — ABNORMAL LOW (ref 3.5–5.0)
Alkaline Phosphatase: 58 U/L (ref 38–126)
Alkaline Phosphatase: 52 U/L (ref 38–126)
Anion gap: 12 (ref 5–15)
Anion gap: 7 (ref 5–15)
Anion gap: 6 (ref 5–15)
BILIRUBIN TOTAL: 1.5 mg/dL — AB (ref 0.3–1.2)
BUN: 18 mg/dL (ref 6–20)
BUN: 20 mg/dL (ref 6–20)
BUN: 19 mg/dL (ref 6–20)
CALCIUM: 5.5 mg/dL — AB (ref 8.9–10.3)
CHLORIDE: 97 mmol/L — AB (ref 101–111)
CHLORIDE: 102 mmol/L (ref 101–111)
CHLORIDE: 101 mmol/L (ref 101–111)
CO2: 20 mmol/L — AB (ref 22–32)
CO2: 21 mmol/L — AB (ref 22–32)
CO2: 25 mmol/L (ref 22–32)
CREATININE: 1.71 mg/dL — AB (ref 0.61–1.24)
Calcium: 5.5 mg/dL — CL (ref 8.9–10.3)
Calcium: 6.6 mg/dL — ABNORMAL LOW (ref 8.9–10.3)
Creatinine, Ser: 1.2 mg/dL (ref 0.61–1.24)
Creatinine, Ser: 1.21 mg/dL (ref 0.61–1.24)
GFR calc Af Amer: 59 mL/min — ABNORMAL LOW (ref >60)
GFR calc Af Amer: >60 mL/min (ref >60)
GFR calc Af Amer: >60 mL/min (ref >60)
GFR calc non Af Amer: 51 mL/min — ABNORMAL LOW (ref >60)
GFR calc non Af Amer: >60 mL/min (ref >60)
GLUCOSE: 152 mg/dL — AB (ref 65–99)
GLUCOSE: 61 mg/dL — AB (ref 65–99)
Glucose, Bld: 165 mg/dL — ABNORMAL HIGH (ref 65–99)
POTASSIUM: 3.4 mmol/L — AB (ref 3.5–5.1)
Potassium: 4.1 mmol/L (ref 3.5–5.1)
Potassium: 4 mmol/L (ref 3.5–5.1)
SODIUM: 129 mmol/L — AB (ref 135–145)
SODIUM: 132 mmol/L — AB (ref 135–145)
Sodium: 130 mmol/L — ABNORMAL LOW (ref 135–145)
Total Bilirubin: 1.8 mg/dL — ABNORMAL HIGH (ref 0.3–1.2)
Total Bilirubin: 0.8 mg/dL (ref 0.3–1.2)
Total Protein: 5.6 g/dL — ABNORMAL LOW (ref 6.5–8.1)
Total Protein: 5.2 g/dL — ABNORMAL LOW (ref 6.5–8.1)
Total Protein: 5.1 g/dL — ABNORMAL LOW (ref 6.5–8.1)

## 2017-12-25 LAB — GLUCOSE, CAPILLARY
GLUCOSE-CAPILLARY: 141 mg/dL — AB (ref 65–99)
GLUCOSE-CAPILLARY: 68 mg/dL (ref 65–99)
Glucose-Capillary: 107 mg/dL — ABNORMAL HIGH (ref 65–99)
Glucose-Capillary: 129 mg/dL — ABNORMAL HIGH (ref 65–99)
Glucose-Capillary: 135 mg/dL — ABNORMAL HIGH (ref 65–99)
Glucose-Capillary: 45 mg/dL — ABNORMAL LOW (ref 65–99)
Glucose-Capillary: 55 mg/dL — ABNORMAL LOW (ref 65–99)

## 2017-12-25 LAB — TRIGLYCERIDES
TRIGLYCERIDES: 934 mg/dL — AB (ref <150)
TRIGLYCERIDES: 908 mg/dL — AB (ref <150)

## 2017-12-25 LAB — LIPASE, BLOOD: Lipase: 119 U/L — ABNORMAL HIGH (ref 11–51)

## 2017-12-25 LAB — LACTIC ACID, PLASMA: Lactic Acid, Venous: 3.6 mmol/L (ref 0.5–1.9)

## 2017-12-25 MED ORDER — PROPOFOL 1000 MG/100ML IV EMUL
INTRAVENOUS | Status: AC
  Filled 2017-12-25: qty 100

## 2017-12-25 MED ORDER — FENTANYL CITRATE (PF) 2500 MCG/50ML IJ SOLN
0.0000 ug/h | INTRAMUSCULAR | Status: DC
  Administered 2017-12-25: 25 ug/h via INTRAVENOUS
  Administered 2017-12-25: 375 ug/h via INTRAVENOUS
  Administered 2017-12-26: 400 ug/h via INTRAVENOUS
  Administered 2017-12-26: 325 ug/h via INTRAVENOUS
  Administered 2017-12-26: 350 ug/h via INTRAVENOUS
  Administered 2017-12-27 – 2017-12-28 (×5): 400 ug/h via INTRAVENOUS
  Filled 2017-12-25 (×11): qty 50

## 2017-12-25 MED ORDER — PANTOPRAZOLE SODIUM 40 MG IV SOLR
40.0000 mg | Freq: Two times a day (BID) | INTRAVENOUS | Status: DC
  Administered 2017-12-25 – 2017-12-29 (×9): 40 mg via INTRAVENOUS
  Filled 2017-12-25 (×9): qty 40

## 2017-12-25 MED ORDER — SODIUM CHLORIDE 0.9 % IV SOLN
4.0000 [IU]/h | INTRAVENOUS | Status: DC
  Administered 2017-12-25: 6 [IU]/h via INTRAVENOUS
  Filled 2017-12-25 (×4): qty 1

## 2017-12-25 MED ORDER — SODIUM CHLORIDE 0.9 % IV SOLN
0.5000 mg/h | INTRAVENOUS | Status: DC
  Administered 2017-12-25: 0.5 mg/h via INTRAVENOUS
  Filled 2017-12-25: qty 10

## 2017-12-25 MED ORDER — ETOMIDATE 2 MG/ML IV SOLN
20.0000 mg | Freq: Once | INTRAVENOUS | Status: AC
  Administered 2017-12-25: 20 mg via INTRAVENOUS

## 2017-12-25 MED ORDER — SODIUM CHLORIDE 0.9 % IV SOLN
1.0000 g | Freq: Once | INTRAVENOUS | Status: AC
  Administered 2017-12-25: 1 g via INTRAVENOUS
  Filled 2017-12-25: qty 10

## 2017-12-25 MED ORDER — ORAL CARE MOUTH RINSE
15.0000 mL | Freq: Four times a day (QID) | OROMUCOSAL | Status: DC
  Administered 2017-12-25 – 2018-01-02 (×29): 15 mL via OROMUCOSAL

## 2017-12-25 MED ORDER — MIDAZOLAM HCL 2 MG/2ML IJ SOLN
INTRAMUSCULAR | Status: AC
  Filled 2017-12-25: qty 4

## 2017-12-25 MED ORDER — SODIUM CHLORIDE 0.9 % IV BOLUS (SEPSIS)
1000.0000 mL | Freq: Once | INTRAVENOUS | Status: AC
  Administered 2017-12-25: 1000 mL via INTRAVENOUS

## 2017-12-25 MED ORDER — SODIUM CHLORIDE 0.9 % IV SOLN
1.0000 mg | Freq: Once | INTRAVENOUS | Status: AC
  Administered 2017-12-25: 1 mg via INTRAVENOUS
  Filled 2017-12-25: qty 0.2

## 2017-12-25 MED ORDER — PROPOFOL 1000 MG/100ML IV EMUL
5.0000 ug/kg/min | INTRAVENOUS | Status: DC
  Administered 2017-12-25: 5 ug/kg/min via INTRAVENOUS
  Administered 2017-12-25 – 2017-12-26 (×2): 25 ug/kg/min via INTRAVENOUS
  Filled 2017-12-25: qty 200

## 2017-12-25 MED ORDER — FENTANYL CITRATE (PF) 100 MCG/2ML IJ SOLN: 100.0000 ug | Freq: Once | INTRAMUSCULAR | Status: AC

## 2017-12-25 MED ORDER — SODIUM BICARBONATE 8.4 % IV SOLN
INTRAVENOUS | Status: DC
  Administered 2017-12-25 (×2): via INTRAVENOUS
  Filled 2017-12-25 (×4): qty 150

## 2017-12-25 MED ORDER — MIDAZOLAM HCL 2 MG/2ML IJ SOLN
4.0000 mg | Freq: Once | INTRAMUSCULAR | Status: AC
  Administered 2017-12-25: 4 mg via INTRAVENOUS

## 2017-12-25 MED ORDER — FENTANYL CITRATE (PF) 100 MCG/2ML IJ SOLN
200.0000 ug | Freq: Once | INTRAMUSCULAR | Status: AC
  Administered 2017-12-25: 200 ug via INTRAVENOUS

## 2017-12-25 MED ORDER — SODIUM BICARBONATE 8.4 % IV SOLN
INTRAVENOUS | Status: DC
Start: 2017-12-25 — End: 2017-12-25
  Administered 2017-12-25: 16:00:00 via INTRAVENOUS
  Filled 2017-12-25: qty 850

## 2017-12-25 MED ORDER — SODIUM CHLORIDE 0.9 % IV SOLN
2.0000 g | INTRAVENOUS | Status: DC | PRN
  Filled 2017-12-25 (×2): qty 20

## 2017-12-25 MED ORDER — CHLORHEXIDINE GLUCONATE 0.12% ORAL RINSE (MEDLINE KIT)
15.0000 mL | Freq: Two times a day (BID) | OROMUCOSAL | Status: DC
  Administered 2017-12-25 – 2018-01-03 (×17): 15 mL via OROMUCOSAL

## 2017-12-25 MED ORDER — SODIUM CHLORIDE 0.9 % IV SOLN
2.0000 mg/h | INTRAVENOUS | Status: DC
  Administered 2017-12-25: 10 mg/h via INTRAVENOUS
  Administered 2017-12-26: 7 mg/h via INTRAVENOUS
  Administered 2017-12-26: 14 mg/h via INTRAVENOUS
  Administered 2017-12-26: 8 mg/h via INTRAVENOUS
  Administered 2017-12-26: 15 mg/h via INTRAVENOUS
  Administered 2017-12-26: 11 mg/h via INTRAVENOUS
  Administered 2017-12-27: 15 mg/h via INTRAVENOUS
  Administered 2017-12-27: 14 mg/h via INTRAVENOUS
  Administered 2017-12-27 (×4): 15 mg/h via INTRAVENOUS
  Administered 2017-12-28: 11 mg/h via INTRAVENOUS
  Administered 2017-12-28: 15 mg/h via INTRAVENOUS
  Administered 2017-12-28: 11 mg/h via INTRAVENOUS
  Administered 2017-12-28: 13 mg/h via INTRAVENOUS
  Administered 2017-12-28: 15 mg/h via INTRAVENOUS
  Administered 2017-12-29 (×2): 10 mg/h via INTRAVENOUS
  Administered 2017-12-29 (×2): 9 mg/h via INTRAVENOUS
  Administered 2017-12-29: 12 mg/h via INTRAVENOUS
  Administered 2017-12-30: 9 mg/h via INTRAVENOUS
  Administered 2017-12-30: 8 mg/h via INTRAVENOUS
  Filled 2017-12-25 (×28): qty 10

## 2017-12-25 MED ORDER — FENTANYL BOLUS VIA INFUSION
50.0000 ug | INTRAVENOUS | Status: DC | PRN
  Administered 2017-12-26 – 2017-12-27 (×2): 50 ug via INTRAVENOUS
  Filled 2017-12-25: qty 50

## 2017-12-25 MED ORDER — MIDAZOLAM HCL 2 MG/2ML IJ SOLN
2.0000 mg | Freq: Once | INTRAMUSCULAR | Status: AC
  Administered 2017-12-25: 2 mg via INTRAVENOUS

## 2017-12-25 MED ORDER — SODIUM CHLORIDE 0.9 % IV SOLN
3.0000 ug/kg/min | INTRAVENOUS | Status: DC
  Administered 2017-12-25: 3 ug/kg/min via INTRAVENOUS
  Administered 2017-12-26: 3.5 ug/kg/min via INTRAVENOUS
  Administered 2017-12-26: 2.5 ug/kg/min via INTRAVENOUS
  Administered 2017-12-27: 3 ug/kg/min via INTRAVENOUS
  Filled 2017-12-25 (×5): qty 20

## 2017-12-25 MED ORDER — CISATRACURIUM BOLUS VIA INFUSION
5.0000 mg | Freq: Once | INTRAVENOUS | Status: DC
  Filled 2017-12-25: qty 5

## 2017-12-25 MED ORDER — DEXTROSE 50 % IV SOLN
INTRAVENOUS | Status: AC
  Administered 2017-12-26: 25 mL via INTRAVENOUS
  Filled 2017-12-25: qty 50

## 2017-12-25 MED ORDER — FENTANYL CITRATE (PF) 100 MCG/2ML IJ SOLN
INTRAMUSCULAR | Status: AC
  Filled 2017-12-25: qty 4

## 2017-12-25 MED ORDER — SODIUM CHLORIDE 0.9 % IV SOLN
2.0000 g | Freq: Once | INTRAVENOUS | Status: AC
  Administered 2017-12-25: 2 g via INTRAVENOUS
  Filled 2017-12-25: qty 20

## 2017-12-25 MED ORDER — MIDAZOLAM BOLUS VIA INFUSION
2.0000 mg | INTRAVENOUS | Status: DC | PRN
  Administered 2017-12-26 – 2017-12-29 (×9): 2 mg via INTRAVENOUS
  Filled 2017-12-25 (×2): qty 2

## 2017-12-25 MED ORDER — POTASSIUM CHLORIDE 10 MEQ/50ML IV SOLN
10.0000 meq | INTRAVENOUS | Status: AC
  Administered 2017-12-25 – 2017-12-26 (×4): 10 meq via INTRAVENOUS
  Filled 2017-12-25 (×4): qty 50

## 2017-12-25 MED ORDER — SODIUM CHLORIDE 0.9 % IV SOLN
1.0000 g | Freq: Three times a day (TID) | INTRAVENOUS | Status: DC
  Administered 2017-12-25 – 2018-01-03 (×27): 1 g via INTRAVENOUS
  Filled 2017-12-25 (×27): qty 1

## 2017-12-25 MED ORDER — ROCURONIUM BROMIDE 50 MG/5ML IV SOLN
50.0000 mg | Freq: Once | INTRAVENOUS | Status: AC
  Administered 2017-12-25: 50 mg via INTRAVENOUS
  Filled 2017-12-25: qty 5

## 2017-12-25 MED ORDER — ARTIFICIAL TEARS OPHTHALMIC OINT
1.0000 "application " | TOPICAL_OINTMENT | Freq: Three times a day (TID) | OPHTHALMIC | Status: DC
  Administered 2017-12-25 – 2017-12-27 (×7): 1 via OPHTHALMIC
  Filled 2017-12-25: qty 3.5

## 2017-12-25 MED ORDER — FENTANYL CITRATE (PF) 100 MCG/2ML IJ SOLN: 100.0000 ug | Freq: Once | INTRAMUSCULAR | Status: DC | PRN

## 2017-12-25 NOTE — Consult Note (Signed)
PULMONARY / CRITICAL CARE MEDICINE   Name: Mark Lucero MRN: [id number] DOB: 1986-08-03    ADMISSION DATE:  12/23/2017 CONSULTATION DATE: December 25, 2017  REFERRING MD: Hospitalist to staff  CHIEF COMPLAINT: Severe pancreatitis with abdominal pain severe abdominal distention  HISTORY OF PRESENT ILLNESS:   Patient is 31 years old Caucasian male with past medical history of ADHD and anxiety who has been binge drinking alcohol over the holidays presents with abdominal pain and was diagnosed with pancreatitis for the past 2 days patient course has gotten worse in the sense he has severe very progressive abdominal distention to the degree that it is interfering with his breathing he is nauseated he has dry heaves he is feeling severe abdominal pain by the time I saw him patient was having difficulty finishing sentences very anxious and severe abdominal pain with dry heaving girlfriend was in the room patient was denying any hallucinations and he is saying that the way his belly feels like a brick in his legs feel so heavy.  PAST MEDICAL HISTORY :  He  has a past medical history of ADHD and Anxiety.  PAST SURGICAL HISTORY: He  has no past surgical history on file.  No Known Allergies  No current facility-administered medications on file prior to encounter.    Current Outpatient Medications on File Prior to Encounter  Medication Sig  . ALPRAZolam (XANAX) 1 MG tablet Take 1 mg by mouth at bedtime as needed for anxiety.   Marland Kitchen. amphetamine-dextroamphetamine (ADDERALL) 20 MG tablet Take 20 mg by mouth 4 (four) times daily as needed (adhd).   Marland Kitchen. aspirin-acetaminophen-caffeine (EXCEDRIN MIGRAINE) 250-250-65 MG tablet Take 1 tablet by mouth every 6 (six) hours as needed for headache or migraine.  Marland Kitchen. ibuprofen (ADVIL,MOTRIN) 200 MG tablet Take 800 mg by mouth every 6 (six) hours as needed for moderate pain.    FAMILY HISTORY:  His indicated that his sister is alive. He indicated that the status of  his neg hx is unknown.   SOCIAL HISTORY: He  reports that  has never smoked. he has never used smokeless tobacco. He reports that he drinks alcohol. He reports that he uses drugs. Drug: Marijuana.  REVIEW OF SYSTEMS:   Parts mentioned HPI 14 points reviewed mentioned  SUBJECTIVE:  And severe abdominal pain dry heaving heavy legs  VITAL SIGNS: BP (!) 107/58   Pulse (!) 119   Temp (!) 100.8 F (38.2 C)   Resp 17   Ht 5\' 10"  (1.778 m)   Wt 209 lb 14.1 oz (95.2 kg)   SpO2 100%   BMI 30.11 kg/m   HEMODYNAMICS:    VENTILATOR SETTINGS: Vent Mode: PRVC FiO2 (%):  [100 %] 100 % Set Rate:  [18 bmp] 18 bmp Vt Set:  [440 mL] 440 mL PEEP:  [5 cmH20] 5 cmH20 Plateau Pressure:  [17 cmH20] 17 cmH20  INTAKE / OUTPUT: I/O last 3 completed shifts: In: 6050 [I.V.:4000; IV Piggyback:2050] Out: -   PHYSICAL EXAMINATION: General: In severe distress from shortness of breath abdominal pain and vomiting Neuro:  WNL , AOX3 , EOMI , CN II-XII intact , UL , LL strength is symmetrical and 5/5 HEENT:  atraumatic , no jaundice , dry mucous membranes  Cardiovascular:  Irregular irregular , ESM 2/6 in the aortic area  Lungs: Decreased air entry with minimal air entry at the bases Abdomen: Abdominal compartment syndrome no bowel sounds patient has severe distention no rebound tenderness and extremely tense abdominal wall. Musculoskeletal: Lower extremities  with extreme dusky color intact pulses no loss of sensation no numbness Skin:  No rash    LABS:  BMET Recent Labs  Lab 12/23/17 1946 12/23/17 1952 12/24/17 0220 12/25/17 0634  NA 131* 133* 130* 129*  K 4.6 4.4 4.3 4.1  CL 98* 101 100* 97*  CO2 21*  --  17* 20*  BUN 16 18 14 18   CREATININE 1.10 1.30* 0.75 1.71*  GLUCOSE 156* 160* 129* 165*    Electrolytes Recent Labs  Lab 12/23/17 1946 12/24/17 0220 12/25/17 0634  CALCIUM 8.0* 6.5* 5.5*    CBC Recent Labs  Lab 12/23/17 1946 12/23/17 1952 12/24/17 0220 12/25/17 0634   WBC 19.2*  --  18.4* 11.6*  HGB 15.9 16.7 16.4 17.8*  HCT 43.2 49.0 44.9 50.3  PLT 291  --  199 237    Coag's No results for input(s): APTT, INR in the last 168 hours.  Sepsis Markers Recent Labs  Lab 12/24/17 0220 12/24/17 0546 12/25/17 0634  LATICACIDVEN 3.2* 2.3* 3.6*    ABG Recent Labs  Lab 12/25/17 1140  PHART 7.389  PCO2ART 29.7*  PO2ART 52.5*    Liver Enzymes Recent Labs  Lab 12/23/17 1946 12/24/17 0220 12/25/17 0634  AST 107* 89* 65*  ALT 55 48 28  ALKPHOS 81 63 66  BILITOT 1.1 1.1 1.8*  ALBUMIN 3.8 3.2* 2.6*    Cardiac Enzymes No results for input(s): TROPONINI, PROBNP in the last 168 hours.  Glucose Recent Labs  Lab 12/23/17 2025  GLUCAP 128*    Imaging Dg Chest Port 1 View  Result Date: 12/25/2017 CLINICAL DATA:  Acute respiratory failure. On ventilator. Alcoholic pancreatitis. EXAM: PORTABLE CHEST 1 VIEW COMPARISON:  12/23/2017 FINDINGS: New left internal jugular central venous catheter is seen with tip overlying the proximal right atrium. Endotracheal tube and nasogastric tube are seen in appropriate position. No pneumothorax visualized. Low lung volumes are seen with bibasilar atelectasis. No evidence of pulmonary consolidation or edema. IMPRESSION: Support lines and tubes in appropriate position. No pneumothorax visualized. Low lung volumes with bibasilar atelectasis. Electronically Signed   By: Myles RosenthalJohn  Stahl M.D.   On: 12/25/2017 15:50       DISCUSSION: This is a 31 years old with severe decompensated pancreatitis alcoholic induced with abdominal compartment syndrome.  ASSESSMENT / PLAN:  PULMONARY A: Patient will be intubated paralyzed for abdominal compartment syndrome P:   That bundle CARDIOVASCULAR A:  Tachycardia with stable blood pressure P:  Observe  RENAL A:   Acute kidney injury P:   Aggressive resuscitation I feel the patient will end up getting worse given his abdominal compartment syndrome and if there is no  improvement is her creatinine will get nephrology involved and we will reinvolve the surgical team for celiotomy  GASTROINTESTINAL A:   N.p.o. abdominal compartment syndrome major bladder pressure 3 times a day intubated paralyzed P:   PPI twice a day  HEMATOLOGIC A:   Leukocytosis P:  We will start covering him with meropenem for pancreatic sepsis  INFECTIOUS A:   Leukocytosis with severe worsening pancreatitis will start covering him for peripancreatic sepsis P:   Repeat lipase in the morning  ENDOCRINE A:   No active issues P:   He might have a secondary diabetes from his acute severe fulminant pancreatitis  NEUROLOGIC A:   Patient will be some dated paralyzed intubated reimaging of his bladder pressure   On discussion with the family his mother and dad and the patient and his fiance his condition  is really critical and he is in very critical condition at this point I explained to the family that the patient will need to be medically treated if he has compartment syndrome that has not improved with endorgan damage with complete shock or acute kidney injury without improvement with high bladder pressures a patient needs to be taken to the OR for celiotomy at this point will try to manage this condition medically but I feel with the patient reports that he already took and the way his abdomen feels and bladder pressure of 25 with dusky color legs acute kidney injury he will end up having to go through relieving abdominal pressure mechanically by celiotomy.   Critical care time is 60 minutes    Pulmonary and Critical Care Medicine [facility] Pager: [phone]  12/25/2017, 4:08 PM

## 2017-12-25 NOTE — Progress Notes (Signed)
Patient abdomen distended & firm.  Positive bowel sounds in all 4 quadrants. HR 124.  Dr. Maryfrances Bunnellanford notified via text page.

## 2017-12-25 NOTE — Progress Notes (Signed)
Temperature corrected ABG on 12/25/17 at approximately 1147 on RA are as follows:  PH 7.39 PCO2 29.7 p02 52.5 cHco3 18.2   RN and RN Charge are aware of these corrected values.

## 2017-12-25 NOTE — Progress Notes (Signed)
CH introduced and offered comfort support to pt's SO. Please page if additional support is needed. Chaplain Elmarie Shileyamela Carrington Batch[place]Holder, [place]   12/25/17 1900  Clinical Encounter Type  Visited With Family

## 2017-12-25 NOTE — Progress Notes (Signed)
Alcoholic pancreatitis  Subjective: Pt's pain is better  Objective: Vital signs in last 24 hours: Temp:  [97 F (36.1 C)-98.8 F (37.1 C)] 97 F (36.1 C) (12/29 0622) Pulse Rate:  [112-125] 119 (12/29 0622) Resp:  [18-20] 18 (12/29 0622) BP: (107-138)/(66-106) 119/81 (12/29 0622) SpO2:  [94 %-98 %] 94 % (12/29 0622) Last BM Date: 12/24/17  Intake/Output from previous day: 12/28 0701 - 12/29 0700 In: 3000 [I.V.:3000] Out: -  Intake/Output this shift: No intake/output data recorded.  General appearance: alert and cooperative GI: normal findings: soft, abd tense, edematous  Lab Results:  Results for orders placed or performed during the hospital encounter of 12/23/17 (from the past 24 hour(s))  Urine rapid drug screen (hosp performed)     Status: Abnormal   Collection Time: 12/24/17  2:28 PM  Result Value Ref Range   Opiates POSITIVE (A) NONE DETECTED   Cocaine NONE DETECTED NONE DETECTED   Benzodiazepines POSITIVE (A) NONE DETECTED   Amphetamines NONE DETECTED NONE DETECTED   Tetrahydrocannabinol NONE DETECTED NONE DETECTED   Barbiturates NONE DETECTED NONE DETECTED  CBC with Differential/Platelet     Status: Abnormal   Collection Time: 12/25/17  6:34 AM  Result Value Ref Range   WBC 11.6 (H) 4.0 - 10.5 K/uL   RBC 5.31 4.22 - 5.81 MIL/uL   Hemoglobin 17.8 (H) 13.0 - 17.0 g/dL   HCT 16.150.3 09.639.0 - 04.552.0 %   MCV 94.7 78.0 - 100.0 fL   MCH 33.5 26.0 - 34.0 pg   MCHC 35.4 30.0 - 36.0 g/dL   RDW 40.912.6 81.111.5 - 91.415.5 %   Platelets 237 150 - 400 K/uL   Neutrophils Relative % 82 %   Neutro Abs 9.5 (H) 1.7 - 7.7 K/uL   Lymphocytes Relative 9 %   Lymphs Abs 1.1 0.7 - 4.0 K/uL   Monocytes Relative 9 %   Monocytes Absolute 1.1 (H) 0.1 - 1.0 K/uL   Eosinophils Relative 0 %   Eosinophils Absolute 0.0 0.0 - 0.7 K/uL   Basophils Relative 0 %   Basophils Absolute 0.0 0.0 - 0.1 K/uL  Comprehensive metabolic panel     Status: Abnormal   Collection Time: 12/25/17  6:34 AM  Result  Value Ref Range   Sodium 129 (L) 135 - 145 mmol/L   Potassium 4.1 3.5 - 5.1 mmol/L   Chloride 97 (L) 101 - 111 mmol/L   CO2 20 (L) 22 - 32 mmol/L   Glucose, Bld 165 (H) 65 - 99 mg/dL   BUN 18 6 - 20 mg/dL   Creatinine, Ser 7.821.71 (H) 0.61 - 1.24 mg/dL   Calcium 5.5 (LL) 8.9 - 10.3 mg/dL   Total Protein 5.6 (L) 6.5 - 8.1 g/dL   Albumin 2.6 (L) 3.5 - 5.0 g/dL   AST 65 (H) 15 - 41 U/L   ALT 28 17 - 63 U/L   Alkaline Phosphatase 66 38 - 126 U/L   Total Bilirubin 1.8 (H) 0.3 - 1.2 mg/dL   GFR calc non Af Amer 51 (L) >60 mL/min   GFR calc Af Amer 59 (L) >60 mL/min   Anion gap 12 5 - 15  Lactic acid, plasma     Status: Abnormal   Collection Time: 12/25/17  6:34 AM  Result Value Ref Range   Lactic Acid, Venous 3.6 (HH) 0.5 - 1.9 mmol/L     Studies/Results Radiology     MEDS, Scheduled . enoxaparin (LOVENOX) injection  40 mg Subcutaneous QHS  . folic  acid  1 mg Oral Daily  . Influenza vac split quadrivalent PF  0.5 mL Intramuscular Tomorrow-1000  . multivitamin with minerals  1 tablet Oral Daily  . thiamine  100 mg Oral Daily   Or  . thiamine  100 mg Intravenous Daily     Assessment/Plan: Alcoholic pancreatitis Pain is better but he still appears under- resuscitated.  Lactic acid and CR increased.  Will bolus 1 L Would recommend transfer to step down and placing a foley so that IVF's can be titrated to UOP Consider CCM consult  Consider abdominal compartment syndrome    LOS: 2 days    Vanita PandaAlicia C Zebadiah Willert, MD [facility], [place] [phone]   12/25/2017 8:52 AM

## 2017-12-25 NOTE — Plan of Care (Signed)
  Elimination: Will not experience complications related to bowel motility 12/25/2017 1631 - Progressing by William Daltonarpenter, Skyllar Notarianni L, RN   Elimination: Will not experience complications related to urinary retention 12/25/2017 1631 - Progressing by William Daltonarpenter, Ritter Helsley L, RN   Pain Managment: General experience of comfort will improve 12/25/2017 1631 - Progressing by William Daltonarpenter, Cashawn Yanko L, RN

## 2017-12-25 NOTE — Progress Notes (Addendum)
Placed PT on 3 lpm [place] post ABG on RA (p02 52.5)- rn aware. Current Sp02 on 3 lpm [place] 95%.

## 2017-12-25 NOTE — Progress Notes (Signed)
Attempted second doppler with ultrasound jelly instead of standard lubricant. Pulses were barely audible, still cold, unable to assess capillary refill.

## 2017-12-25 NOTE — Procedures (Signed)
Name:  Mark PollackGraham A Halberstam MRN:  [id number] DOB:  Jun 22, 1986  PROCEDURE NOTE  Procedure:  Central venous catheter placement.  Indications:  Need for intravenous access and hemodynamic monitoring.  Consent:  Consent was implied due to the emergency nature of the procedure.  Anesthesia:  A total of 10 mL of 1% Lidocaine was used for local infiltration anesthesia.  Procedure summary:  Appropriate equipment was assembled.  The patient was identified as Mark Lucero and safety timeout was performed. The patient was placed in Trendelenburg position.  Sterile technique was used. The patient's left IJ area anterior chest wall was prepped using chlorhexidine / alcohol scrub and the field was draped in usual sterile fashion with full body drape. After the adequate anesthesia was achieved, the Left IJ was cannulated with the introducer needle without  difficulty. A guide wire was advanced through the introducer needle, which was then withdrawn. A small skin incision was made at the point of wire entry, the dilator was inserted over the guide wire and appropriate dilation was obtained. The dilator was removed and  triple-lumen catheter was advanced over the guide wire, which was then removed.  All ports were aspirated and flushed with normal saline without difficulty. The catheter was secured into place at 22 cm. Antibiotic patch was placed and sterile dressing was applied. Post-procedure chest x-ray was ordered.  Complications:  No immediate complications were noted.  Hemodynamic parameters and oxygenation remained stable throughout the procedure.  Estimated blood loss:  Less then 5 mL.  Ileana RoupAhmad M Tilda Samudio, MD Pulmonary and Critical Care Medicine [facility] Cell: [phone]  12/25/2017, 3:53 PM

## 2017-12-25 NOTE — Procedures (Signed)
Name: Mark Lucero Friedland MRN: [id number] DOB: [birth year]   PROCEDURE NOTE  Procedure:  Endotracheal intubation.  Indication:  Acute respiratory failure  Consent:  Consent was implied due to the emergency nature of the procedure.  Anesthesia:  Lucero total of 10 mg of Etomidate was given intravenously.  Procedure summary:  Appropriate equipment was assembled. The patient was identified as Mark Lucero Doenges and safety timeout was performed. The patient was placed supine, with head in sniffing position. After adequate level of anesthesia was achieved, Lucero Glide 4  blade was inserted into the oropharynx and the vocal cords were visualized. Lucero 8.0 endotracheal tube was inserted without difficulty and visualized going through the vocal cords. The stylette was removed and cuff inflated. Colorimetric change was noted on the CO2 meter. Breath sounds were heard over both lung fields equally. ETT was secured at 22 lip line.  Post procedure chest xray was ordered.  Complications:  No immediate complications were noted.  Hemodynamic parameters and oxygenation remained stable throughout the procedure.    Pulmonary and Critical Care Medicine [facility] Pager: [phone]  12/25/2017, 3:51 PM

## 2017-12-25 NOTE — Progress Notes (Signed)
Patient abdomen distended and firm.  Patient stated he voided 2 times last night but small amount.  Bladder scan preformed which showed 0 mL.  Hands and feet both cold to touch with discoloration.  HR 119.  Ca+ 5.5 & Lactic Acid 3.6.  Dr. Maryfrances Bunnellanford notified via text page.

## 2017-12-25 NOTE — Progress Notes (Signed)
Aid called to say that patient's feet felt ice cold. Assessed pt's feet found them cold with nailbeds so pale capillary refill could not be assessed. Attempted to check pedal pulses with doppler, but found none. Pt says he does have sensation to feet. Nightshift hospitalist has been notified.

## 2017-12-25 NOTE — Progress Notes (Signed)
[facility]  All questions answered.

## 2017-12-25 NOTE — Progress Notes (Signed)
Advanced ETT to 24cm lip per MD order (under Care order- Nursing). ETT originally was at 22cm lip- now resides at 24cm lip. RN aware.

## 2017-12-25 NOTE — Progress Notes (Signed)
CRITICAL VALUE ALERT  Critical Value:  Ca+ 5.5 & Lactic Acid 3.6  Date & Time Notied:  12/25/17 0744  Provider Notified: yes via text page  Orders Received/Actions taken: none at this time

## 2017-12-25 NOTE — Progress Notes (Signed)
eLink Physician-Brief Progress Note Patient Name: Mark Lucero DOB: 08-Aug-1986 MRN: [id number]   Date of Service  12/25/2017  HPI/Events of Note  Hypokalemia hypocalcemia  eICU Interventions  Potassium and Calcium replaced     Intervention Category Intermediate Interventions: Electrolyte abnormality - evaluation and management  Lenorris Karger 12/25/2017, 11:00 PM

## 2017-12-25 NOTE — Progress Notes (Signed)
PROGRESS NOTE    Mark Lucero  YNW:[id number] DOB: 1986-07-24 DOA: 12/23/2017 PCP: Patient, No Pcp Per      Brief Narrative:  31 y.o. male with medical history significant of anxiety presenting with abdominal pain.  He reports having chest and abdominal pain with pain that started the day after Christmas but really got bad today.  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  He has been drinking over Christmas, "too much probably."  He drank 2 bootleggers yesterday - 12 oz each.  He also drank a little bit of egg nog.  He drank about the same amount the day before.  He is prescribed Alprazolam and Adderall and also took Excedrin.  +marijuana on Christmas Day.   He went to urgent care today and got dizzy and passed out.  He is uncertain if he had chest compressions done.     Assessment & Plan:  Principal Problem:   Alcoholic pancreatitis Active Problems:   ADHD   Anxiety   Hyperglycemia   Alcohol abuse   Marijuana abuse   Hyponatremia   Acute pancreatitis from alcohol Initially, glucose/AST normal, only WBC.  Now, with new hypocalcemia, >6L fluids needed in first 48.  Appears worse.  Abdomen taut.  Agree with Gen Surg, appears under-resuscitated, needs SDU.  Discussed with Gen surg and CCM.   -Bolus now and continue 250 cc/hr -Foley and titrate fluids to maintain UOP >50cc/hr at minimum -Check intraabdominal compartment pressure -ABG ordered -If acidotic, significantly low PO2 or compartment pressure significantly elevated, will consult CCM for eval for intubation/compartment syndr   Acute kidney injury Baseline Cr 0.7, today 1.7, more than doubled.  Pre-renal from pancreatitis. -IVF and trend  Hypocalcemia -IV Ca  Transaminitis Mild, from alcohol. -Complete alcohol cessation recommended  Hyponatremia Worsening -IVF and trend  Alcohol abuse Low likelihood of withdrawals -CIWA with thiamine, etc for  now  Other medications -Hold Adderall and Xanax     DVT prophylaxis: Lovenox Code Status: FULL Family Communication: Mother and father at bedside Disposition Plan: Aggressive fluids, intraabdominal compartment presures, ABG.  Possible CCM consult.     Consultants:   Gen Surg  Procedures:   None  Antimicrobials:   None    Subjective: Pain manageable, wants food.  Nausea present.  Feels weak.  No new fever, confusion.  Urine output minimal.    Objective: Vitals:   12/24/17 1858 12/24/17 2125 12/25/17 0622 12/25/17 1100  BP: 128/89 107/66 119/81 (!) 133/104  Pulse: (!) 124 (!) 125 (!) 119   Resp: 20 19 18  (!) 22  Temp: (!) 97.4 F (36.3 C) 97.6 F (36.4 C) (!) 97 F (36.1 C)   TempSrc: Oral Oral Axillary   SpO2: 98% 94% 94% 92%  Height:        Intake/Output Summary (Last 24 hours) at 12/25/2017 1117 Last data filed at 12/25/2017 1111 Gross per 24 hour  Intake 2000 ml  Output 250 ml  Net 1750 ml   There were no vitals filed for this visit.  Examination: General appearance: Adult male, awake but appears a little more slowed to respond.   HEENT: Anicteric, conjunctiva pink, lids and lashes normal. No nasal deformity, discharge, epistaxis.  Lips dry.   Skin: Warm and dry.  No jaundice.  No suspicious rashes or lesions.  No mottling of legs, trunk.  Feet are cold and livedo present.   Cardiac: Tachycardic but regular, nl S1-S2,  no murmurs appreciated.  Capillary refill is brisk on feet, belly.  JVP normal.  No LE edema.  Radial pulses 2+ and symmetric.  DP pulses weak but present. Respiratory: Tachypneic.  CTAB without rales or wheezes. Abdomen: Abdomen firm, distended, pain throughout.    MSK: No deformities or effusions. Neuro: Awake and alert.  EOMI, moves all extremities. Speech fluent.   Seems al ittle more sedated/obtunded today.   Psych: Sensorium intact and responding to questions, attention normal. Affect blunted.  Judgment and insight appear  normal.    Data Reviewed: I have personally reviewed following labs and imaging studies:  CBC: Recent Labs  Lab 12/23/17 1946 12/23/17 1952 12/24/17 0220 12/25/17 0634  WBC 19.2*  --  18.4* 11.6*  NEUTROABS 16.9*  --   --  9.5*  HGB 15.9 16.7 16.4 17.8*  HCT 43.2 49.0 44.9 50.3  MCV 91.7  --  94.9 94.7  PLT 291  --  199 237   Basic Metabolic Panel: Recent Labs  Lab 12/23/17 1946 12/23/17 1952 12/24/17 0220 12/25/17 0634  NA 131* 133* 130* 129*  K 4.6 4.4 4.3 4.1  CL 98* 101 100* 97*  CO2 21*  --  17* 20*  GLUCOSE 156* 160* 129* 165*  BUN 16 18 14 18   CREATININE 1.10 1.30* 0.75 1.71*  CALCIUM 8.0*  --  6.5* 5.5*   GFR: CrCl cannot be calculated (Unknown ideal weight.). Liver Function Tests: Recent Labs  Lab 12/23/17 1946 12/24/17 0220 12/25/17 0634  AST 107* 89* 65*  ALT 55 48 28  ALKPHOS 81 63 66  BILITOT 1.1 1.1 1.8*  PROT 6.6 5.4* 5.6*  ALBUMIN 3.8 3.2* 2.6*   Recent Labs  Lab 12/23/17 1947  LIPASE 1,424*  AMYLASE 659*   No results for input(s): AMMONIA in the last 168 hours. Coagulation Profile: No results for input(s): INR, PROTIME in the last 168 hours. Cardiac Enzymes: No results for input(s): CKTOTAL, CKMB, CKMBINDEX, TROPONINI in the last 168 hours. BNP (last 3 results) No results for input(s): PROBNP in the last 8760 hours. HbA1C: No results for input(s): HGBA1C in the last 72 hours. CBG: Recent Labs  Lab 12/23/17 2025  GLUCAP 128*   Lipid Profile: No results for input(s): CHOL, HDL, LDLCALC, TRIG, CHOLHDL, LDLDIRECT in the last 72 hours. Thyroid Function Tests: No results for input(s): TSH, T4TOTAL, FREET4, T3FREE, THYROIDAB in the last 72 hours. Anemia Panel: No results for input(s): VITAMINB12, FOLATE, FERRITIN, TIBC, IRON, RETICCTPCT in the last 72 hours. Urine analysis: No results found for: COLORURINE, APPEARANCEUR, LABSPEC, PHURINE, GLUCOSEU, HGBUR, BILIRUBINUR, KETONESUR, PROTEINUR, UROBILINOGEN, NITRITE,  LEUKOCYTESUR Sepsis Labs: @LABRCNTIP (procalcitonin:4,lacticacidven:4)  )No results found for this or any previous visit (from the past 240 hour(s)).       Radiology Studies: Ct Abdomen Pelvis W Contrast  Result Date: 12/23/2017 CLINICAL DATA:  31 year old male with abdominal trauma. Nausea vomiting. EXAM: CT ABDOMEN AND PELVIS WITH CONTRAST TECHNIQUE: Multidetector CT imaging of the abdomen and pelvis was performed using the standard protocol following bolus administration of intravenous contrast. CONTRAST:  ISOVUE-300 IOPAMIDOL (ISOVUE-300) INJECTION 61% COMPARISON:  None. FINDINGS: Lower chest: Minimal bibasilar atelectatic changes. No intra-abdominal free air. Upper abdominal inflammatory changes and small amount of fluid. Hepatobiliary: No focal liver abnormality is seen. No gallstones, gallbladder wall thickening, or biliary dilatation. Pancreas: There is inflammatory changes of the pancreas with small amount of peripancreatic fluid. No loculated or drainable fluid collection/ abscess or pseudocyst. Spleen: Normal in size without focal abnormality. Adrenals/Urinary Tract: The adrenal  glands are unremarkable. Multiple small nonobstructing bilateral renal calculi measure up to 5 mm in the inferior pole of the right kidney. There is no hydronephrosis on either side. The visualized ureters and urinary bladder appear unremarkable. Stomach/Bowel: Mild inflammatory changes of the duodenum, likely reactive to inflammation of the pancreas. There is no bowel obstruction. The appendix is not visualized, likely surgically absent. Vascular/Lymphatic: No significant vascular findings are present. No enlarged abdominal or pelvic lymph nodes. Reproductive: The prostate and seminal vesicles are grossly unremarkable. Other: None Musculoskeletal: No acute osseous pathology. There is expansile appearance of the right iliac bone with a mixed lytic and sclerotic or ground-glass area measuring 6.9 x 2.1 cm. There is no  cortical breakage or obvious associated soft tissue. 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 Clinical correlation and direct comparison with prior images, if available recommended. If no prior images are available. This can be followed up with radiograph or MRI. IMPRESSION: 1. Acute pancreatitis.  No abscess. 2. Small nonobstructing bilateral renal calculi.  No hydronephrosis. 3. 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 Direct comparison with prior images, if available, or follow-up recommended. Electronically Signed   By: Elgie CollardArash  Radparvar M.D.   On: 12/23/2017 20:24   Dg Chest Portable 1 View  Result Date: 12/23/2017 CLINICAL DATA:  Syncope. EXAM: PORTABLE CHEST 1 VIEW COMPARISON:  None. FINDINGS: The heart size and mediastinal contours are within normal limits. Both lungs are clear except for minimal linear atelectasis at the left lung base. Old healed fracture of the mid right clavicle. IMPRESSION: Minimal linear atelectasis at the left lung base. Electronically Signed   By: Francene BoyersJames  Maxwell M.D.   On: 12/23/2017 19:56        Scheduled Meds: . enoxaparin (LOVENOX) injection  40 mg Subcutaneous QHS  . folic acid  1 mg Oral Daily  . Influenza vac split quadrivalent PF  0.5 mL Intramuscular Tomorrow-1000  . multivitamin with minerals  1 tablet Oral Daily  . thiamine  100 mg Oral Daily   Or  . thiamine  100 mg Intravenous Daily   Continuous Infusions: . calcium gluconate    . lactated ringers 250 mL/hr at 12/25/17 0444     LOS: 2 days    Time spent: 45 minutes    Alberteen Samhristopher P Dorinda Stehr, MD [facility] 12/25/2017, 11:17 AM     Pager [phone] --- please page though AMION:  [email or web address] If 7PM-7AM, please contact night-coverage

## 2017-12-26 ENCOUNTER — Inpatient Hospital Stay (HOSPITAL_COMMUNITY): Payer: Self-pay

## 2017-12-26 LAB — CBC WITH DIFFERENTIAL/PLATELET
BASOS ABS: 0 10*3/uL (ref 0.0–0.1)
BASOS PCT: 0 %
EOS PCT: 1 %
Eosinophils Absolute: 0.1 10*3/uL (ref 0.0–0.7)
HCT: 37 % — ABNORMAL LOW (ref 39.0–52.0)
Hemoglobin: 13.1 g/dL (ref 13.0–17.0)
Lymphocytes Relative: 19 %
Lymphs Abs: 1.6 10*3/uL (ref 0.7–4.0)
MCH: 33.6 pg (ref 26.0–34.0)
MCHC: 35.4 g/dL (ref 30.0–36.0)
MCV: 94.9 fL (ref 78.0–100.0)
MONO ABS: 1.1 10*3/uL — AB (ref 0.1–1.0)
MONOS PCT: 13 %
Neutro Abs: 5.6 10*3/uL (ref 1.7–7.7)
Neutrophils Relative %: 67 %
PLATELETS: 253 10*3/uL (ref 150–400)
RBC: 3.9 MIL/uL — ABNORMAL LOW (ref 4.22–5.81)
RDW: 13 % (ref 11.5–15.5)
WBC: 8.4 10*3/uL (ref 4.0–10.5)

## 2017-12-26 LAB — COMPREHENSIVE METABOLIC PANEL
ALBUMIN: 2.1 g/dL — AB (ref 3.5–5.0)
ALT: 19 U/L (ref 17–63)
ANION GAP: 5 (ref 5–15)
AST: 54 U/L — AB (ref 15–41)
Alkaline Phosphatase: 48 U/L (ref 38–126)
BILIRUBIN TOTAL: 1 mg/dL (ref 0.3–1.2)
BUN: 15 mg/dL (ref 6–20)
CHLORIDE: 97 mmol/L — AB (ref 101–111)
CO2: 30 mmol/L (ref 22–32)
Calcium: 5.6 mg/dL — CL (ref 8.9–10.3)
Creatinine, Ser: 1.11 mg/dL (ref 0.61–1.24)
GFR calc Af Amer: >60 mL/min (ref >60)
Glucose, Bld: 48 mg/dL — ABNORMAL LOW (ref 65–99)
POTASSIUM: 3.2 mmol/L — AB (ref 3.5–5.1)
Sodium: 132 mmol/L — ABNORMAL LOW (ref 135–145)
TOTAL PROTEIN: 5 g/dL — AB (ref 6.5–8.1)

## 2017-12-26 LAB — APTT: APTT: 34 s (ref 24–36)

## 2017-12-26 LAB — PROTIME-INR
INR: 1.06
PROTHROMBIN TIME: 13.7 s (ref 11.4–15.2)

## 2017-12-26 LAB — GLUCOSE, CAPILLARY
GLUCOSE-CAPILLARY: 101 mg/dL — AB (ref 65–99)
GLUCOSE-CAPILLARY: 117 mg/dL — AB (ref 65–99)
GLUCOSE-CAPILLARY: 36 mg/dL — AB (ref 65–99)
GLUCOSE-CAPILLARY: 40 mg/dL — AB (ref 65–99)
GLUCOSE-CAPILLARY: 46 mg/dL — AB (ref 65–99)
GLUCOSE-CAPILLARY: 51 mg/dL — AB (ref 65–99)
GLUCOSE-CAPILLARY: 70 mg/dL (ref 65–99)
GLUCOSE-CAPILLARY: 88 mg/dL (ref 65–99)
GLUCOSE-CAPILLARY: 93 mg/dL (ref 65–99)
Glucose-Capillary: 68 mg/dL (ref 65–99)
Glucose-Capillary: 78 mg/dL (ref 65–99)
Glucose-Capillary: 31 mg/dL — CL (ref 65–99)
Glucose-Capillary: 64 mg/dL — ABNORMAL LOW (ref 65–99)
Glucose-Capillary: 108 mg/dL — ABNORMAL HIGH (ref 65–99)

## 2017-12-26 LAB — BLOOD GAS, ARTERIAL
ACID-BASE EXCESS: 3.9 mmol/L — AB (ref 0.0–2.0)
Bicarbonate: 28.2 mmol/L — ABNORMAL HIGH (ref 20.0–28.0)
DRAWN BY: 422461
FIO2: 90
MECHVT: 440 mL
O2 Saturation: 97.3 %
PEEP/CPAP: 5 cmH2O
Patient temperature: 38.7
RATE: 20 resp/min
pCO2 arterial: 47.3 mmHg (ref 32.0–48.0)
pH, Arterial: 7.402 (ref 7.350–7.450)
pO2, Arterial: 96.7 mmHg (ref 83.0–108.0)

## 2017-12-26 LAB — LIPASE, BLOOD: LIPASE: 95 U/L — AB (ref 11–51)

## 2017-12-26 LAB — BASIC METABOLIC PANEL
Anion gap: 5 (ref 5–15)
BUN: 9 mg/dL (ref 6–20)
CHLORIDE: 97 mmol/L — AB (ref 101–111)
CO2: 28 mmol/L (ref 22–32)
Calcium: 6.4 mg/dL — CL (ref 8.9–10.3)
Creatinine, Ser: 0.81 mg/dL (ref 0.61–1.24)
GFR calc Af Amer: >60 mL/min (ref >60)
GFR calc non Af Amer: >60 mL/min (ref >60)
Glucose, Bld: 138 mg/dL — ABNORMAL HIGH (ref 65–99)
POTASSIUM: 4.1 mmol/L (ref 3.5–5.1)
SODIUM: 130 mmol/L — AB (ref 135–145)

## 2017-12-26 LAB — MAGNESIUM
MAGNESIUM: 1.4 mg/dL — AB (ref 1.7–2.4)
MAGNESIUM: 2.3 mg/dL (ref 1.7–2.4)

## 2017-12-26 LAB — HIV ANTIBODY (ROUTINE TESTING W REFLEX): HIV Screen 4th Generation wRfx: NONREACTIVE

## 2017-12-26 LAB — PHOSPHORUS: Phosphorus: <1 mg/dL — CL (ref 2.5–4.6)

## 2017-12-26 LAB — TRIGLYCERIDES: Triglycerides: 624 mg/dL — ABNORMAL HIGH (ref <150)

## 2017-12-26 MED ORDER — ACETAMINOPHEN 650 MG RE SUPP
650.0000 mg | Freq: Four times a day (QID) | RECTAL | Status: DC | PRN
Start: 2017-12-26 — End: 2017-12-28

## 2017-12-26 MED ORDER — DEXTROSE 50 % IV SOLN
25.0000 mL | Freq: Once | INTRAVENOUS | Status: AC
  Administered 2017-12-26: 25 mL via INTRAVENOUS

## 2017-12-26 MED ORDER — DEXTROSE 10 % IV SOLN
INTRAVENOUS | Status: DC
  Administered 2017-12-26 – 2017-12-27 (×5): via INTRAVENOUS
  Administered 2017-12-27: 125 mL/h via INTRAVENOUS
  Administered 2017-12-27 (×2): via INTRAVENOUS
  Administered 2017-12-28: 125 mL/h via INTRAVENOUS
  Administered 2017-12-29 – 2017-12-30 (×4): via INTRAVENOUS
  Filled 2017-12-26 (×2): qty 1000

## 2017-12-26 MED ORDER — MAGNESIUM SULFATE 4 GM/100ML IV SOLN
4.0000 g | Freq: Once | INTRAVENOUS | Status: AC
  Administered 2017-12-26: 4 g via INTRAVENOUS
  Filled 2017-12-26: qty 100

## 2017-12-26 MED ORDER — DEXTROSE 50 % IV SOLN
50.0000 mL | Freq: Once | INTRAVENOUS | Status: AC
  Administered 2017-12-26: 50 mL via INTRAVENOUS

## 2017-12-26 MED ORDER — POTASSIUM CHLORIDE 10 MEQ/50ML IV SOLN
10.0000 meq | INTRAVENOUS | Status: AC
  Administered 2017-12-26 (×6): 10 meq via INTRAVENOUS
  Filled 2017-12-26 (×6): qty 50

## 2017-12-26 MED ORDER — INSULIN ASPART 100 UNIT/ML ~~LOC~~ SOLN
2.0000 [IU] | SUBCUTANEOUS | Status: DC
  Administered 2017-12-27 – 2017-12-30 (×6): 2 [IU] via SUBCUTANEOUS

## 2017-12-26 MED ORDER — SODIUM CHLORIDE 0.9 % IV SOLN
INTRAVENOUS | Status: DC
  Administered 2017-12-26 – 2017-12-27 (×3): via INTRAVENOUS
  Administered 2017-12-28 – 2017-12-29 (×2): 50 mL/h via INTRAVENOUS

## 2017-12-26 MED ORDER — DEXTROSE 50 % IV SOLN
INTRAVENOUS | Status: AC
  Administered 2017-12-26: 50 mL via INTRAVENOUS
  Filled 2017-12-26: qty 50

## 2017-12-26 MED ORDER — SODIUM CHLORIDE 0.9 % IV SOLN
2.0000 g | Freq: Once | INTRAVENOUS | Status: AC
  Administered 2017-12-26: 2 g via INTRAVENOUS
  Filled 2017-12-26: qty 20

## 2017-12-26 MED ORDER — DEXTROSE 50 % IV SOLN: 25.0000 mL | Freq: Once | INTRAVENOUS | Status: AC

## 2017-12-26 MED ORDER — DEXTROSE 50 % IV SOLN
INTRAVENOUS | Status: AC
  Filled 2017-12-26: qty 50

## 2017-12-26 MED ORDER — POTASSIUM & SODIUM PHOSPHATES 280-160-250 MG PO PACK
1.0000 | PACK | Freq: Three times a day (TID) | ORAL | Status: DC
  Administered 2017-12-26 – 2017-12-27 (×2): 1
  Filled 2017-12-26 (×2): qty 1

## 2017-12-26 MED ORDER — ACETAMINOPHEN 325 MG PO TABS
650.0000 mg | ORAL_TABLET | Freq: Four times a day (QID) | ORAL | Status: DC | PRN
  Administered 2017-12-26 – 2017-12-28 (×6): 650 mg
  Filled 2017-12-26 (×6): qty 2

## 2017-12-26 MED ORDER — DEXTROSE 50 % IV SOLN
50.0000 mL | Freq: Once | INTRAVENOUS | Status: AC
  Administered 2017-12-26: 50 mL via INTRAVENOUS
  Filled 2017-12-26: qty 50

## 2017-12-26 NOTE — Progress Notes (Signed)
eLink Physician-Brief Progress Note Patient Name: Mark Lucero DOB: 07-Mar-1986 MRN: [id number]   Date of Service  12/26/2017  HPI/Events of Note  PO4--- < 1.0, K+ = 4.1 and Creatinine = 0.81.  eICU Interventions  Will replace PO4---.     Intervention Category Major Interventions: Electrolyte abnormality - evaluation and management  Sommer,Steven Eugene 12/26/2017, 7:31 PM

## 2017-12-26 NOTE — Progress Notes (Signed)
Hypoglycemic Event  CBG: 46  Treatment: D50 IV 25 mL  Symptoms: None  Follow-up CBG: Time: 0023 CBG Result:70  Possible Reasons for Event: Medication regimen: IV insulin  Comments/MD notified:MD notified    Carolann Littlerobert D Jaleen Grupp

## 2017-12-26 NOTE — Progress Notes (Signed)
Pt's SO was bedside when I arrived; his sister and brother in law joined us near the conclusion of our visit. Pt's mother was in the hallway and she introduced herself and we have a visit outside of pt's room. Pt's SO said pt is better. Pt's mother talked about family dynamics, including having a daughter who is recovering addict and this son and SO who use alcohol. Pt's mother seemed concerned about her son but w/pleased w/hospital care. Pt's family encouraged to request Chaplain assistance is needed. Please page [phone] if needed. Chaplain Marjory Liesamela Carrington Holder, MDv   12/26/17 1700  Clinical Encounter Type  Visited With Family

## 2017-12-26 NOTE — Progress Notes (Signed)
CRITICAL VALUE ALERT  Critical Value:  Ca+ - 5.6  Date & Time Notied: 12/26/2017 0605  Provider Notified: Dr. Darrick Pennaeterding    Orders Received/Actions taken: Calcium Gluconate 2g

## 2017-12-26 NOTE — Progress Notes (Signed)
eLink Physician-Brief Progress Note Patient Name: Mark PollackGraham A Lucero DOB: [birth year] MRN: [id number]   Date of Service  12/26/2017  HPI/Events of Note  Continued issues with hypoglycemia in the setting of elevated TG precipitating acute pancreatitis.  Blood sugar in the 50s  eICU Interventions  Continue with insulin but decrease dose to 4 units D50 given Increase D10 infusion to 125 ml/hr F/U on TG level Communicated to PCCM AM rounding MD re paitent's status     Intervention Category Intermediate Interventions: Other:  DETERDING,ELIZABETH 12/26/2017, 6:47 AM

## 2017-12-26 NOTE — Progress Notes (Signed)
eLink Physician-Brief Progress Note Patient Name: Mark PollackGraham A Lucero DOB: 1986/03/29 MRN: [id number]   Date of Service  12/26/2017  HPI/Events of Note  Hypocalcemia Hypokalemia hypomag  eICU Interventions  Calcium, potassium and mag replaced     Intervention Category Intermediate Interventions: Electrolyte abnormality - evaluation and management  [name] 12/26/2017, 6:05 AM

## 2017-12-26 NOTE — Progress Notes (Signed)
LCSW consulted for current substance abuse  Aware of consult, however unable to assess and complete due to patient being on full vent support. Chart reviewed and will follow acutely. Will complete assessment and consult when patient able to full participate.  Barrier: medically unstable to complete.    Deretha EmoryHannah Tamari Busic LCSW, MSW Clinical Social Work: Optician, dispensingystem [employer] Coverage for :  [phone]

## 2017-12-26 NOTE — Progress Notes (Signed)
Hyponatremia  Subjective: Intubated yesterday.  Having electrolyte imbalances.  On a bicarb gtt for acidosis  Objective: Vital signs in last 24 hours: Temp:  [96.3 F (35.7 C)-101.8 F (38.8 C)] 101.7 F (38.7 C) (12/30 0300) Pulse Rate:  [103-120] 103 (12/30 0042) Resp:  [14-22] 20 (12/30 0300) BP: (107-212)/(58-155) 153/89 (12/30 0300) SpO2:  [90 %-100 %] 95 % (12/30 0300) FiO2 (%):  [60 %-100 %] 60 % (12/30 0400) Weight:  [95.2 kg (209 lb 14.1 oz)] 95.2 kg (209 lb 14.1 oz) (12/29 1600) Last BM Date: 12/25/17  Intake/Output from previous day: 12/29 0701 - 12/30 0700 In: 12216.5 [I.V.:11866.3; IV Piggyback:340.2] Out: 2876 [OTLXB:2620; Emesis/NG output:450] Intake/Output this shift: No intake/output data recorded.  General appearance: intubated and sedated GI: normal findings:  abd tense, edematous, no obvious signs of peritonitis  Lab Results:  Results for orders placed or performed during the hospital encounter of 12/23/17 (from the past 24 hour(s))  MRSA PCR Screening     Status: None   Collection Time: 12/25/17 10:00 AM  Result Value Ref Range   MRSA by PCR NEGATIVE NEGATIVE  Blood gas, arterial     Status: Abnormal   Collection Time: 12/25/17 11:40 AM  Result Value Ref Range   pH, Arterial 7.389 7.350 - 7.450   pCO2 arterial 29.7 (L) 32.0 - 48.0 mmHg   pO2, Arterial 52.5 (L) 83.0 - 108.0 mmHg   Bicarbonate 18.2 (L) 20.0 - 28.0 mmol/L   Acid-base deficit 6.0 (H) 0.0 - 2.0 mmol/L   O2 Saturation 91.9 %   Patient temperature 94.0    Collection site RIGHT RADIAL    Drawn by [id number]    Sample type ARTERIAL DRAW    Allens test (pass/fail) PASS PASS  Blood gas, arterial     Status: Abnormal   Collection Time: 12/25/17  4:28 PM  Result Value Ref Range   FIO2 100.00    Delivery systems VENTILATOR    Mode PRESSURE REGULATED VOLUME CONTROL    VT 440 mL   LHR 18 resp/min   Peep/cpap 5.0 cm H20   pH, Arterial 7.261 (L) 7.350 - 7.450   pCO2 arterial 48.2 (H) 32.0 -  48.0 mmHg   pO2, Arterial 96.0 83.0 - 108.0 mmHg   Bicarbonate 20.6 20.0 - 28.0 mmol/L   Acid-base deficit 5.9 (H) 0.0 - 2.0 mmol/L   O2 Saturation 96.2 %   Patient temperature 100.4    Collection site RIGHT RADIAL    Drawn by [id number]    Sample type ARTERIAL DRAW   Triglycerides     Status: Abnormal   Collection Time: 12/25/17  4:34 PM  Result Value Ref Range   Triglycerides 934 (H) <150 mg/dL  Comprehensive metabolic panel     Status: Abnormal   Collection Time: 12/25/17  4:34 PM  Result Value Ref Range   Sodium 130 (L) 135 - 145 mmol/L   Potassium 4.0 3.5 - 5.1 mmol/L   Chloride 102 101 - 111 mmol/L   CO2 21 (L) 22 - 32 mmol/L   Glucose, Bld 152 (H) 65 - 99 mg/dL   BUN 20 6 - 20 mg/dL   Creatinine, Ser 1.20 0.61 - 1.24 mg/dL   Calcium 5.5 (LL) 8.9 - 10.3 mg/dL   Total Protein 5.2 (L) 6.5 - 8.1 g/dL   Albumin 2.4 (L) 3.5 - 5.0 g/dL   AST 56 (H) 15 - 41 U/L   ALT 22 17 - 63 U/L   Alkaline Phosphatase 58 38 -  126 U/L   Total Bilirubin 1.5 (H) 0.3 - 1.2 mg/dL   GFR calc non Af Amer >60 >60 mL/min   GFR calc Af Amer >60 >60 mL/min   Anion gap 7 5 - 15  Glucose, capillary     Status: Abnormal   Collection Time: 12/25/17  4:47 PM  Result Value Ref Range   Glucose-Capillary 141 (H) 65 - 99 mg/dL  Glucose, capillary     Status: Abnormal   Collection Time: 12/25/17  6:49 PM  Result Value Ref Range   Glucose-Capillary 129 (H) 65 - 99 mg/dL  Glucose, capillary     Status: Abnormal   Collection Time: 12/25/17  7:46 PM  Result Value Ref Range   Glucose-Capillary 135 (H) 65 - 99 mg/dL  Triglycerides     Status: Abnormal   Collection Time: 12/25/17  8:24 PM  Result Value Ref Range   Triglycerides 908 (H) <150 mg/dL  Lipase, blood     Status: Abnormal   Collection Time: 12/25/17  8:24 PM  Result Value Ref Range   Lipase 119 (H) 11 - 51 U/L  Comprehensive metabolic panel     Status: Abnormal   Collection Time: 12/25/17  8:24 PM  Result Value Ref Range   Sodium 132 (L) 135 - 145  mmol/L   Potassium 3.4 (L) 3.5 - 5.1 mmol/L   Chloride 101 101 - 111 mmol/L   CO2 25 22 - 32 mmol/L   Glucose, Bld 61 (L) 65 - 99 mg/dL   BUN 19 6 - 20 mg/dL   Creatinine, Ser 1.21 0.61 - 1.24 mg/dL   Calcium 6.6 (L) 8.9 - 10.3 mg/dL   Total Protein 5.1 (L) 6.5 - 8.1 g/dL   Albumin 2.3 (L) 3.5 - 5.0 g/dL   AST 53 (H) 15 - 41 U/L   ALT 20 17 - 63 U/L   Alkaline Phosphatase 52 38 - 126 U/L   Total Bilirubin 0.8 0.3 - 1.2 mg/dL   GFR calc non Af Amer >60 >60 mL/min   GFR calc Af Amer >60 >60 mL/min   Anion gap 6 5 - 15  Glucose, capillary     Status: Abnormal   Collection Time: 12/25/17  8:42 PM  Result Value Ref Range   Glucose-Capillary 107 (H) 65 - 99 mg/dL  Glucose, capillary     Status: None   Collection Time: 12/25/17  9:53 PM  Result Value Ref Range   Glucose-Capillary 68 65 - 99 mg/dL  Glucose, capillary     Status: Abnormal   Collection Time: 12/25/17 10:43 PM  Result Value Ref Range   Glucose-Capillary 55 (L) 65 - 99 mg/dL  Glucose, capillary     Status: Abnormal   Collection Time: 12/25/17 11:54 PM  Result Value Ref Range   Glucose-Capillary 45 (L) 65 - 99 mg/dL  Glucose, capillary     Status: None   Collection Time: 12/26/17 12:23 AM  Result Value Ref Range   Glucose-Capillary 70 65 - 99 mg/dL  Blood gas, arterial     Status: Abnormal   Collection Time: 12/26/17 12:27 AM  Result Value Ref Range   FIO2 90.00    Delivery systems VENTILATOR    Mode PRESSURE REGULATED VOLUME CONTROL    VT 440 mL   LHR 20 resp/min   Peep/cpap 5.0 cm H20   pH, Arterial 7.402 7.350 - 7.450   pCO2 arterial 47.3 32.0 - 48.0 mmHg   pO2, Arterial 96.7 83.0 - 108.0 mmHg  Bicarbonate 28.2 (H) 20.0 - 28.0 mmol/L   Acid-Base Excess 3.9 (H) 0.0 - 2.0 mmol/L   O2 Saturation 97.3 %   Patient temperature 38.7    Collection site LEFT RADIAL    Drawn by [id number]    Sample type ARTERIAL DRAW    Allens test (pass/fail) PASS PASS  Glucose, capillary     Status: Abnormal   Collection Time:  12/26/17  1:16 AM  Result Value Ref Range   Glucose-Capillary 39 (LL) 65 - 99 mg/dL   Comment 1 Repeat Test   Glucose, capillary     Status: Abnormal   Collection Time: 12/26/17  1:18 AM  Result Value Ref Range   Glucose-Capillary 36 (LL) 65 - 99 mg/dL  Glucose, capillary     Status: Abnormal   Collection Time: 12/26/17  1:56 AM  Result Value Ref Range   Glucose-Capillary 46 (L) 65 - 99 mg/dL  Glucose, capillary     Status: None   Collection Time: 12/26/17  2:22 AM  Result Value Ref Range   Glucose-Capillary 68 65 - 99 mg/dL  Glucose, capillary     Status: None   Collection Time: 12/26/17  2:59 AM  Result Value Ref Range   Glucose-Capillary 78 65 - 99 mg/dL  APTT     Status: None   Collection Time: 12/26/17  3:48 AM  Result Value Ref Range   aPTT 34 24 - 36 seconds  CBC with Differential/Platelet     Status: Abnormal   Collection Time: 12/26/17  3:48 AM  Result Value Ref Range   WBC 8.4 4.0 - 10.5 K/uL   RBC 3.90 (L) 4.22 - 5.81 MIL/uL   Hemoglobin 13.1 13.0 - 17.0 g/dL   HCT 37.0 (L) 39.0 - 52.0 %   MCV 94.9 78.0 - 100.0 fL   MCH 33.6 26.0 - 34.0 pg   MCHC 35.4 30.0 - 36.0 g/dL   RDW 13.0 11.5 - 15.5 %   Platelets 253 150 - 400 K/uL   Neutrophils Relative % 67 %   Neutro Abs 5.6 1.7 - 7.7 K/uL   Lymphocytes Relative 19 %   Lymphs Abs 1.6 0.7 - 4.0 K/uL   Monocytes Relative 13 %   Monocytes Absolute 1.1 (H) 0.1 - 1.0 K/uL   Eosinophils Relative 1 %   Eosinophils Absolute 0.1 0.0 - 0.7 K/uL   Basophils Relative 0 %   Basophils Absolute 0.0 0.0 - 0.1 K/uL  Comprehensive metabolic panel     Status: Abnormal   Collection Time: 12/26/17  3:48 AM  Result Value Ref Range   Sodium 132 (L) 135 - 145 mmol/L   Potassium 3.2 (L) 3.5 - 5.1 mmol/L   Chloride 97 (L) 101 - 111 mmol/L   CO2 30 22 - 32 mmol/L   Glucose, Bld 48 (L) 65 - 99 mg/dL   BUN 15 6 - 20 mg/dL   Creatinine, Ser 1.11 0.61 - 1.24 mg/dL   Calcium 5.6 (LL) 8.9 - 10.3 mg/dL   Total Protein 5.0 (L) 6.5 - 8.1  g/dL   Albumin 2.1 (L) 3.5 - 5.0 g/dL   AST 54 (H) 15 - 41 U/L   ALT 19 17 - 63 U/L   Alkaline Phosphatase 48 38 - 126 U/L   Total Bilirubin 1.0 0.3 - 1.2 mg/dL   GFR calc non Af Amer >60 >60 mL/min   GFR calc Af Amer >60 >60 mL/min   Anion gap 5 5 - 15  Magnesium  Status: Abnormal   Collection Time: 12/26/17  3:48 AM  Result Value Ref Range   Magnesium 1.4 (L) 1.7 - 2.4 mg/dL  Protime-INR     Status: None   Collection Time: 12/26/17  3:48 AM  Result Value Ref Range   Prothrombin Time 13.7 11.4 - 15.2 seconds   INR 1.06   Lipase, blood     Status: Abnormal   Collection Time: 12/26/17  3:48 AM  Result Value Ref Range   Lipase 95 (H) 11 - 51 U/L  Triglycerides     Status: Abnormal   Collection Time: 12/26/17  3:48 AM  Result Value Ref Range   Triglycerides 624 (H) <150 mg/dL  Glucose, capillary     Status: Abnormal   Collection Time: 12/26/17  4:44 AM  Result Value Ref Range   Glucose-Capillary 31 (LL) 65 - 99 mg/dL  Glucose, capillary     Status: Abnormal   Collection Time: 12/26/17  4:51 AM  Result Value Ref Range   Glucose-Capillary 40 (LL) 65 - 99 mg/dL  Glucose, capillary     Status: None   Collection Time: 12/26/17  5:26 AM  Result Value Ref Range   Glucose-Capillary 88 65 - 99 mg/dL  Glucose, capillary     Status: Abnormal   Collection Time: 12/26/17  6:42 AM  Result Value Ref Range   Glucose-Capillary 51 (L) 65 - 99 mg/dL     Studies/Results Radiology     MEDS, Scheduled . artificial tears  1 application Both Eyes L7N  . chlorhexidine gluconate (MEDLINE KIT)  15 mL Mouth Rinse BID  . cisatracurium  5 mg Intravenous Once  . dextrose  50 mL Intravenous Once  . dextrose      . enoxaparin (LOVENOX) injection  40 mg Subcutaneous QHS  . Influenza vac split quadrivalent PF  0.5 mL Intramuscular Tomorrow-1000  . mouth rinse  15 mL Mouth Rinse QID  . multivitamin with minerals  1 tablet Oral Daily  . pantoprazole (PROTONIX) IV  40 mg Intravenous Q12H  .  thiamine  100 mg Oral Daily   Or  . thiamine  100 mg Intravenous Daily     Assessment/Plan: Severe alcoholic pancreatitis Appears to be better resuscitated this am.  UOP adequate, cont supportive care per CCM No surgical interventions recommended at this time Will follow with you    LOS: 3 days    Rosario Adie, MD [facility], [place]   12/26/2017 7:29 AM

## 2017-12-26 NOTE — Progress Notes (Signed)
eLink Physician-Brief Progress Note Patient Name: Shaune PollackGraham A Vierling DOB: 05-11-1986 MRN: [id number]   Date of Service  12/26/2017  HPI/Events of Note  PH corrected on Bicarb gtt but now running at 300cc/hr for persistent hypoglycemia.  Patient has been given several amps of D50  eICU Interventions  Plan: Reduce bicarb gtt to 100/hr Start D10 for persistent hypoglycemia     Intervention Category Major Interventions: Acid-Base disturbance - evaluation and management;Other:  Zanobia Griebel 12/26/2017, 2:02 AM
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
## 2017-12-26 NOTE — Progress Notes (Signed)
Hypoglycemic Event  CBG: 39  Treatment: D50 IV 25 mL  Symptoms: None  Follow-up CBG: Time:0153 CBG Result:46  Possible Reasons for Event: Medication regimen: IV Insulin  Comments/MD notified: MD notified, D10 increased to 75, continuing with D50 (25ml) until CBG >/= 70    Carolann Littlerobert D Rae Sutcliffe

## 2017-12-27 ENCOUNTER — Inpatient Hospital Stay (HOSPITAL_COMMUNITY): Payer: Self-pay

## 2017-12-27 DIAGNOSIS — J9600 Acute respiratory failure, unspecified whether with hypoxia or hypercapnia: Secondary | ICD-10-CM

## 2017-12-27 LAB — COMPREHENSIVE METABOLIC PANEL
ALT: 15 U/L — AB (ref 17–63)
AST: 43 U/L — ABNORMAL HIGH (ref 15–41)
Albumin: 2 g/dL — ABNORMAL LOW (ref 3.5–5.0)
Alkaline Phosphatase: 46 U/L (ref 38–126)
Anion gap: 5 (ref 5–15)
BUN: 7 mg/dL (ref 6–20)
CALCIUM: 6.1 mg/dL — AB (ref 8.9–10.3)
CHLORIDE: 97 mmol/L — AB (ref 101–111)
CO2: 28 mmol/L (ref 22–32)
CREATININE: 0.88 mg/dL (ref 0.61–1.24)
Glucose, Bld: 129 mg/dL — ABNORMAL HIGH (ref 65–99)
Potassium: 3.9 mmol/L (ref 3.5–5.1)
Sodium: 130 mmol/L — ABNORMAL LOW (ref 135–145)
Total Bilirubin: 0.7 mg/dL (ref 0.3–1.2)
Total Protein: 5 g/dL — ABNORMAL LOW (ref 6.5–8.1)

## 2017-12-27 LAB — HEPATITIS PANEL, ACUTE
HCV Ab: <0.1 s/co ratio (ref 0.0–0.9)
HEP B C IGM: NEGATIVE
Hep A IgM: NEGATIVE
Hepatitis B Surface Ag: NEGATIVE

## 2017-12-27 LAB — BLOOD GAS, ARTERIAL
Acid-Base Excess: 3.7 mmol/L — ABNORMAL HIGH (ref 0.0–2.0)
BICARBONATE: 28.8 mmol/L — AB (ref 20.0–28.0)
Drawn by: 422461
FIO2: 45
LHR: 20 {breaths}/min
O2 Saturation: 92.7 %
PEEP: 5 cmH2O
Patient temperature: 38.4
VT: 440 mL
pCO2 arterial: 51.9 mmHg — ABNORMAL HIGH (ref 32.0–48.0)
pH, Arterial: 7.371 (ref 7.350–7.450)
pO2, Arterial: 68.8 mmHg — ABNORMAL LOW (ref 83.0–108.0)

## 2017-12-27 LAB — GLUCOSE, CAPILLARY
GLUCOSE-CAPILLARY: 101 mg/dL — AB (ref 65–99)
GLUCOSE-CAPILLARY: 39 mg/dL — AB (ref 65–99)
GLUCOSE-CAPILLARY: 85 mg/dL (ref 65–99)
GLUCOSE-CAPILLARY: 89 mg/dL (ref 65–99)
Glucose-Capillary: 129 mg/dL — ABNORMAL HIGH (ref 65–99)
Glucose-Capillary: 95 mg/dL (ref 65–99)
Glucose-Capillary: 103 mg/dL — ABNORMAL HIGH (ref 65–99)

## 2017-12-27 LAB — CBC
HCT: 31.1 % — ABNORMAL LOW (ref 39.0–52.0)
HEMOGLOBIN: 10.8 g/dL — AB (ref 13.0–17.0)
MCH: 34.1 pg — ABNORMAL HIGH (ref 26.0–34.0)
MCHC: 34.7 g/dL (ref 30.0–36.0)
MCV: 98.1 fL (ref 78.0–100.0)
Platelets: 194 10*3/uL (ref 150–400)
RBC: 3.17 MIL/uL — ABNORMAL LOW (ref 4.22–5.81)
RDW: 13.4 % (ref 11.5–15.5)
WBC: 7.8 10*3/uL (ref 4.0–10.5)

## 2017-12-27 LAB — TRIGLYCERIDES: TRIGLYCERIDES: 419 mg/dL — AB (ref <150)

## 2017-12-27 LAB — MAGNESIUM: MAGNESIUM: 2 mg/dL (ref 1.7–2.4)

## 2017-12-27 LAB — LACTIC ACID, PLASMA: Lactic Acid, Venous: 1.6 mmol/L (ref 0.5–1.9)

## 2017-12-27 LAB — PHOSPHORUS

## 2017-12-27 MED ORDER — SODIUM PHOSPHATES 45 MMOLE/15ML IV SOLN
30.0000 mmol | Freq: Once | INTRAVENOUS | Status: AC
  Administered 2017-12-27: 30 mmol via INTRAVENOUS
  Filled 2017-12-27: qty 10

## 2017-12-27 MED ORDER — FUROSEMIDE 10 MG/ML IJ SOLN
40.0000 mg | Freq: Once | INTRAMUSCULAR | Status: AC
  Administered 2017-12-27: 40 mg via INTRAVENOUS
  Filled 2017-12-27: qty 4

## 2017-12-27 MED ORDER — SODIUM CHLORIDE 0.9 % IV SOLN
2.0000 g | Freq: Once | INTRAVENOUS | Status: AC
  Administered 2017-12-27: 2 g via INTRAVENOUS
  Filled 2017-12-27: qty 20

## 2017-12-27 NOTE — Progress Notes (Signed)
Date: December 27, 2017 Marcelle SmilingRhonda Davis, BSN, [place]3, [place] [phone] Chart and notes review for patient progress and needs. Ventilator remains full support/febrile/wbc elevated/sedated Will follow for case management and discharge needs. Next review date: [id number]

## 2017-12-27 NOTE — Progress Notes (Signed)
CRITICAL VALUE ALERT  Critical Value:  Ca+ = 6.1, P = < 1.0  Date & Time Notied:  12/27/2017 0532   Provider Notified: Dr. Arsenio LoaderSommer    Orders Received/Actions taken: Order to give PRN dose of calcium gluconate and Phosphorus is currently being replaced.
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
## 2017-12-27 NOTE — Progress Notes (Signed)
Subjective/Chief Complaint: Intubated and sedated   Objective: Vital signs in last 24 hours: Temp:  [94.8 F (34.9 C)-102 F (38.9 C)] 100.8 F (38.2 C) (12/31 0600) Pulse Rate:  [94-99] 99 (12/31 0350) Resp:  [18-20] 20 (12/31 0744) BP: (99-152)/(60-85) 142/75 (12/31 0600) SpO2:  [91 %-99 %] 91 % (12/31 0744) FiO2 (%):  [45 %-50 %] 45 % (12/31 0744) Last BM Date: 12/25/17  Intake/Output from previous day: 12/30 0701 - 12/31 0700 In: 6209.8 [I.V.:5749.8; IV Piggyback:400] Out: 4095 [Urine:3225; Emesis/NG output:870] Intake/Output this shift: No intake/output data recorded.  General appearance: intubated and sedated Resp: clear to auscultation bilaterally and on vent Cardio: regular rate and rhythm GI: very distended. quiet  Lab Results:  Recent Labs    12/26/17 0348 12/27/17 0410  WBC 8.4 7.8  HGB 13.1 10.8*  HCT 37.0* 31.1*  PLT 253 194   BMET Recent Labs    12/26/17 1648 12/27/17 0410  NA 130* 130*  K 4.1 3.9  CL 97* 97*  CO2 28 28  GLUCOSE 138* 129*  BUN 9 7  CREATININE 0.81 0.88  CALCIUM 6.4* 6.1*   PT/INR Recent Labs    12/26/17 0348  LABPROT 13.7  INR 1.06   ABG Recent Labs    12/26/17 0027 12/27/17 0427  PHART 7.402 7.371  HCO3 28.2* 28.8*    Studies/Results: Dg Chest Port 1 View  Result Date: 12/27/2017 CLINICAL DATA:  Respiratory failure EXAM: PORTABLE CHEST 1 VIEW COMPARISON:  Portable chest x-ray of December 26, 2017 FINDINGS: The lungs are mildly hypoinflated. There is a smaller moderate-sized pleural effusion layering posteriorly and laterally on the right. There is density at both lung bases compatible with atelectasis. The heart is top-normal in size. The pulmonary vascularity is mildly prominent centrally. The endotracheal tube tip projects approximately 3 cm above the carina. The esophagogastric tube tip in proximal port project below the inferior margin of the image. The left internal jugular venous catheter tip projects  at the cavoatrial junction. IMPRESSION: Fairly stable appearance of the chest. Persistent bibasilar atelectasis or pneumonia with small right pleural effusion. Possible low-grade CHF. The support tubes are in reasonable position. Electronically Signed   By: David  SwazilandJordan M.D.   On: 12/27/2017 07:14   Dg Chest Port 1 View  Result Date: 12/26/2017 CLINICAL DATA:  Acute respiratory failure EXAM: PORTABLE CHEST 1 VIEW COMPARISON:  12/25/2017 FINDINGS: Cardiac shadow is stable. Endotracheal tube, nasogastric catheter and left jugular central line are again seen and stable. The overall inspiratory effort is again poor with slight increase in bilateral small effusions and bibasilar atelectatic changes. No bony abnormality is noted. IMPRESSION: Tubes and lines as described above. Poor inspiratory effort with bites lateral small effusions and bibasilar atelectasis. Electronically Signed   By: Alcide CleverMark  Lukens M.D.   On: 12/26/2017 07:16   Dg Chest Port 1 View  Result Date: 12/25/2017 CLINICAL DATA:  Acute respiratory failure. On ventilator. Alcoholic pancreatitis. EXAM: PORTABLE CHEST 1 VIEW COMPARISON:  12/23/2017 FINDINGS: New left internal jugular central venous catheter is seen with tip overlying the proximal right atrium. Endotracheal tube and nasogastric tube are seen in appropriate position. No pneumothorax visualized. Low lung volumes are seen with bibasilar atelectasis. No evidence of pulmonary consolidation or edema. IMPRESSION: Support lines and tubes in appropriate position. No pneumothorax visualized. Low lung volumes with bibasilar atelectasis. Electronically Signed   By: Myles RosenthalJohn  Stahl M.D.   On: 12/25/2017 15:50    Anti-infectives: Anti-infectives (From admission, onward)   Start  Dose/Rate Route Frequency Ordered Stop   12/25/17 1600  meropenem (MERREM) 1 g in sodium chloride 0.9 % 100 mL IVPB    Comments:  Pharmacy Adjust the dose   1 g 200 mL/hr over 30 Minutes Intravenous Every 8 hours  12/25/17 1546     12/23/17 2000  piperacillin-tazobactam (ZOSYN) IVPB 3.375 g     3.375 g 100 mL/hr over 30 Minutes Intravenous  Once 12/23/17 1946 12/23/17 2139      Assessment/Plan: s/p * No surgery found * VDRF per CCM  Alcoholic pancreatitis. Continue supportive care No role for surgery at this point but we will follow along  LOS: 4 days    TOTH III,Quinterious Walraven S 12/27/2017
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
## 2017-12-28 ENCOUNTER — Inpatient Hospital Stay (HOSPITAL_COMMUNITY): Payer: Self-pay

## 2017-12-28 DIAGNOSIS — R6510 Systemic inflammatory response syndrome (SIRS) of non-infectious origin without acute organ dysfunction: Secondary | ICD-10-CM

## 2017-12-28 LAB — GLUCOSE, CAPILLARY
GLUCOSE-CAPILLARY: 80 mg/dL (ref 65–99)
GLUCOSE-CAPILLARY: 95 mg/dL (ref 65–99)
GLUCOSE-CAPILLARY: 113 mg/dL — AB (ref 65–99)
Glucose-Capillary: 101 mg/dL — ABNORMAL HIGH (ref 65–99)
Glucose-Capillary: 98 mg/dL (ref 65–99)
Glucose-Capillary: 129 mg/dL — ABNORMAL HIGH (ref 65–99)

## 2017-12-28 LAB — CBC
HEMATOCRIT: 27.6 % — AB (ref 39.0–52.0)
Hemoglobin: 9.4 g/dL — ABNORMAL LOW (ref 13.0–17.0)
MCH: 34.2 pg — AB (ref 26.0–34.0)
MCHC: 34.1 g/dL (ref 30.0–36.0)
MCV: 100.4 fL — ABNORMAL HIGH (ref 78.0–100.0)
PLATELETS: 201 10*3/uL (ref 150–400)
RBC: 2.75 MIL/uL — ABNORMAL LOW (ref 4.22–5.81)
RDW: 13.8 % (ref 11.5–15.5)
WBC: 9.1 10*3/uL (ref 4.0–10.5)

## 2017-12-28 LAB — BASIC METABOLIC PANEL
Anion gap: 6 (ref 5–15)
CALCIUM: 6.5 mg/dL — AB (ref 8.9–10.3)
CO2: 31 mmol/L (ref 22–32)
Chloride: 99 mmol/L — ABNORMAL LOW (ref 101–111)
Creatinine, Ser: 0.75 mg/dL (ref 0.61–1.24)
GFR calc Af Amer: >60 mL/min (ref >60)
GLUCOSE: 121 mg/dL — AB (ref 65–99)
Potassium: 3.3 mmol/L — ABNORMAL LOW (ref 3.5–5.1)
Sodium: 136 mmol/L (ref 135–145)

## 2017-12-28 LAB — MAGNESIUM: Magnesium: 1.9 mg/dL (ref 1.7–2.4)

## 2017-12-28 LAB — PHOSPHORUS: Phosphorus: 1.3 mg/dL — ABNORMAL LOW (ref 2.5–4.6)

## 2017-12-28 LAB — PROCALCITONIN: PROCALCITONIN: 19.15 ng/mL

## 2017-12-28 MED ORDER — POTASSIUM CHLORIDE 20 MEQ/15ML (10%) PO SOLN
40.0000 meq | Freq: Once | ORAL | Status: AC
  Administered 2017-12-28: 40 meq
  Filled 2017-12-28: qty 30

## 2017-12-28 MED ORDER — VITAL HIGH PROTEIN PO LIQD: 1000.0000 mL | ORAL | Status: DC

## 2017-12-28 MED ORDER — SODIUM CHLORIDE 0.9 % IV SOLN
0.5000 mg/h | INTRAVENOUS | Status: DC
Start: 2017-12-28 — End: 2017-12-30
  Administered 2017-12-28: 0.5 mg/h via INTRAVENOUS
  Administered 2017-12-29: 4 mg/h via INTRAVENOUS
  Administered 2017-12-29: 2 mg/h via INTRAVENOUS
  Administered 2017-12-29: 3.5 mg/h via INTRAVENOUS
  Administered 2017-12-29: 2 mg/h via INTRAVENOUS
  Administered 2017-12-30: 3.5 mg/h via INTRAVENOUS
  Filled 2017-12-28 (×4): qty 5

## 2017-12-28 MED ORDER — POTASSIUM PHOSPHATES 15 MMOLE/5ML IV SOLN
10.0000 mmol | Freq: Once | INTRAVENOUS | Status: AC
  Administered 2017-12-28: 10 mmol via INTRAVENOUS
  Filled 2017-12-28: qty 3.33

## 2017-12-28 MED ORDER — HYDROMORPHONE HCL 1 MG/ML IJ SOLN
0.5000 mg | INTRAMUSCULAR | Status: DC | PRN
Start: 2017-12-28 — End: 2017-12-30
  Administered 2017-12-30: 0.5 mg via INTRAVENOUS

## 2017-12-28 MED ORDER — HYDROMORPHONE HCL 1 MG/ML IJ SOLN
1.0000 mg | INTRAMUSCULAR | Status: DC | PRN
Start: 2017-12-28 — End: 2017-12-28

## 2017-12-28 MED ORDER — MAGNESIUM SULFATE 2 GM/50ML IV SOLN
2.0000 g | Freq: Once | INTRAVENOUS | Status: AC
  Administered 2017-12-28: 2 g via INTRAVENOUS
  Filled 2017-12-28: qty 50

## 2017-12-28 MED ORDER — ACETAMINOPHEN 160 MG/5ML PO SOLN
650.0000 mg | Freq: Four times a day (QID) | ORAL | Status: DC | PRN
  Administered 2017-12-28 – 2017-12-30 (×4): 650 mg
  Filled 2017-12-28 (×4): qty 20.3

## 2017-12-28 MED ORDER — FUROSEMIDE 10 MG/ML IJ SOLN
80.0000 mg | Freq: Once | INTRAMUSCULAR | Status: AC
  Administered 2017-12-28: 80 mg via INTRAVENOUS
  Filled 2017-12-28: qty 8

## 2017-12-28 MED ORDER — PRO-STAT SUGAR FREE PO LIQD
30.0000 mL | Freq: Two times a day (BID) | ORAL | Status: DC
  Administered 2017-12-28 – 2018-01-02 (×9): 30 mL via ORAL
  Filled 2017-12-28 (×11): qty 30

## 2017-12-28 MED ORDER — VITAL 1.5 CAL PO LIQD
1000.0000 mL | ORAL | Status: DC
  Administered 2017-12-28: 1000 mL
  Filled 2017-12-28: qty 1000
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
## 2017-12-28 NOTE — Progress Notes (Signed)
Subjective/Chief Complaint: Sedated and on vent   Objective: Vital signs in last 24 hours: Temp:  [100.6 F (38.1 C)-103.1 F (39.5 C)] 102 F (38.9 C) (01/01 0700) Pulse Rate:  [105-116] 105 (01/01 0800) Resp:  [8-22] 22 (01/01 0800) BP: (92-179)/(33-108) 122/71 (01/01 0700) SpO2:  [89 %-99 %] 91 % (01/01 0800) FiO2 (%):  [40 %-45 %] 40 % (01/01 0800) Last BM Date: 12/25/17  Intake/Output from previous day: 12/31 0701 - 01/01 0700 In: 6493.2 [I.V.:5628.2; NG/GT:60; IV Piggyback:680] Out: 7829 [FAOZH:0865; Emesis/NG output:700] Intake/Output this shift: No intake/output data recorded.  General appearance: sedated and on vent Resp: clear to auscultation bilaterally and on vent Cardio: regular rate and rhythm GI: distended. quiet  Lab Results:  Recent Labs    12/27/17 0410 12/28/17 0529  WBC 7.8 9.1  HGB 10.8* 9.4*  HCT 31.1* 27.6*  PLT 194 201   BMET Recent Labs    12/27/17 0410 12/28/17 0529  NA 130* 136  K 3.9 3.3*  CL 97* 99*  CO2 28 31  GLUCOSE 129* 121*  BUN 7 <5*  CREATININE 0.88 0.75  CALCIUM 6.1* 6.5*   PT/INR Recent Labs    12/26/17 0348  LABPROT 13.7  INR 1.06   ABG Recent Labs    12/26/17 0027 12/27/17 0427  PHART 7.402 7.371  HCO3 28.2* 28.8*    Studies/Results: Dg Chest Port 1 View  Result Date: 12/28/2017 CLINICAL DATA:  Respiratory failure EXAM: PORTABLE CHEST 1 VIEW COMPARISON:  12/27/2017 FINDINGS: Endotracheal tube in good position. Central line tip cavoatrial junction unchanged Hypoventilation unchanged. Bilateral pleural effusions and bibasilar atelectasis unchanged. Probable fluid overload and mild edema IMPRESSION: No significant change.  Endotracheal tube in good position. Hypoventilation with bilateral pleural effusions and bilateral atelectasis. Probable fluid overload. Electronically Signed   By: Marlan Palau M.D.   On: 12/28/2017 06:58   Dg Chest Port 1 View  Result Date: 12/27/2017 CLINICAL DATA:   Respiratory failure EXAM: PORTABLE CHEST 1 VIEW COMPARISON:  Portable chest x-ray of December 26, 2017 FINDINGS: The lungs are mildly hypoinflated. There is a smaller moderate-sized pleural effusion layering posteriorly and laterally on the right. There is density at both lung bases compatible with atelectasis. The heart is top-normal in size. The pulmonary vascularity is mildly prominent centrally. The endotracheal tube tip projects approximately 3 cm above the carina. The esophagogastric tube tip in proximal port project below the inferior margin of the image. The left internal jugular venous catheter tip projects at the cavoatrial junction. IMPRESSION: Fairly stable appearance of the chest. Persistent bibasilar atelectasis or pneumonia with small right pleural effusion. Possible low-grade CHF. The support tubes are in reasonable position. Electronically Signed   By: David  [place] M.D.   On: 12/27/2017 07:14    Anti-infectives: Anti-infectives (From admission, onward)   Start     Dose/Rate Route Frequency Ordered Stop   12/25/17 1600  meropenem (MERREM) 1 g in sodium chloride 0.9 % 100 mL IVPB    Comments:  Pharmacy Adjust the dose   1 g 200 mL/hr over 30 Minutes Intravenous Every 8 hours 12/25/17 1546     12/23/17 2000  piperacillin-tazobactam (ZOSYN) IVPB 3.375 g     3.375 g 100 mL/hr over 30 Minutes Intravenous  Once 12/23/17 1946 12/23/17 2139      Assessment/Plan: s/p * No surgery found * Continue ng and bowel rest for alcoholic pancreatitis. No indication for surgery at this point Critical care per CCM  LOS: 5 days  TOTH III,Anquanette Bahner S 12/28/2017

## 2017-12-29 ENCOUNTER — Inpatient Hospital Stay (HOSPITAL_COMMUNITY): Payer: Self-pay

## 2017-12-29 LAB — BLOOD GAS, ARTERIAL
ACID-BASE EXCESS: 10.2 mmol/L — AB (ref 0.0–2.0)
Bicarbonate: 36.2 mmol/L — ABNORMAL HIGH (ref 20.0–28.0)
DRAWN BY: 270211
FIO2: 0.6
MECHVT: 440 mL
O2 Saturation: 94.4 %
PEEP/CPAP: 5 cmH2O
PO2 ART: 71 mmHg — AB (ref 83.0–108.0)
Patient temperature: 98.6
RATE: 22 resp/min
pCO2 arterial: 55.9 mmHg — ABNORMAL HIGH (ref 32.0–48.0)
pH, Arterial: 7.426 (ref 7.350–7.450)

## 2017-12-29 LAB — CBC WITH DIFFERENTIAL/PLATELET
BASOS ABS: 0 10*3/uL (ref 0.0–0.1)
Basophils Relative: 0 %
Eosinophils Absolute: 0.1 10*3/uL (ref 0.0–0.7)
Eosinophils Relative: 1 %
HCT: 26 % — ABNORMAL LOW (ref 39.0–52.0)
Hemoglobin: 8.8 g/dL — ABNORMAL LOW (ref 13.0–17.0)
LYMPHS ABS: 1.2 10*3/uL (ref 0.7–4.0)
LYMPHS PCT: 11 %
MCH: 34.4 pg — AB (ref 26.0–34.0)
MCHC: 33.8 g/dL (ref 30.0–36.0)
MCV: 101.6 fL — ABNORMAL HIGH (ref 78.0–100.0)
Monocytes Absolute: 1.7 10*3/uL — ABNORMAL HIGH (ref 0.1–1.0)
Monocytes Relative: 15 %
NEUTROS ABS: 8 10*3/uL — AB (ref 1.7–7.7)
Neutrophils Relative %: 73 %
PLATELETS: 292 10*3/uL (ref 150–400)
RBC: 2.56 MIL/uL — AB (ref 4.22–5.81)
RDW: 13.9 % (ref 11.5–15.5)
WBC: 11 10*3/uL — AB (ref 4.0–10.5)

## 2017-12-29 LAB — BASIC METABOLIC PANEL
Anion gap: 8 (ref 5–15)
Anion gap: 10 (ref 5–15)
BUN: 5 mg/dL — AB (ref 6–20)
CHLORIDE: 96 mmol/L — AB (ref 101–111)
CO2: 34 mmol/L — AB (ref 22–32)
CO2: 33 mmol/L — ABNORMAL HIGH (ref 22–32)
Calcium: 7 mg/dL — ABNORMAL LOW (ref 8.9–10.3)
Calcium: 6.9 mg/dL — ABNORMAL LOW (ref 8.9–10.3)
Chloride: 94 mmol/L — ABNORMAL LOW (ref 101–111)
Creatinine, Ser: 0.78 mg/dL (ref 0.61–1.24)
Creatinine, Ser: 0.84 mg/dL (ref 0.61–1.24)
GFR calc Af Amer: >60 mL/min (ref >60)
GFR calc non Af Amer: >60 mL/min (ref >60)
GLUCOSE: 97 mg/dL (ref 65–99)
Glucose, Bld: 117 mg/dL — ABNORMAL HIGH (ref 65–99)
POTASSIUM: 3.4 mmol/L — AB (ref 3.5–5.1)
POTASSIUM: 3.1 mmol/L — AB (ref 3.5–5.1)
SODIUM: 137 mmol/L (ref 135–145)
Sodium: 138 mmol/L (ref 135–145)

## 2017-12-29 LAB — GLUCOSE, CAPILLARY
Glucose-Capillary: 115 mg/dL — ABNORMAL HIGH (ref 65–99)
Glucose-Capillary: 120 mg/dL — ABNORMAL HIGH (ref 65–99)
Glucose-Capillary: 105 mg/dL — ABNORMAL HIGH (ref 65–99)
Glucose-Capillary: 118 mg/dL — ABNORMAL HIGH (ref 65–99)
Glucose-Capillary: 131 mg/dL — ABNORMAL HIGH (ref 65–99)
Glucose-Capillary: 133 mg/dL — ABNORMAL HIGH (ref 65–99)

## 2017-12-29 LAB — MAGNESIUM: MAGNESIUM: 2 mg/dL (ref 1.7–2.4)

## 2017-12-29 LAB — PROCALCITONIN: Procalcitonin: 11 ng/mL

## 2017-12-29 LAB — PHOSPHORUS: Phosphorus: 1.4 mg/dL — ABNORMAL LOW (ref 2.5–4.6)

## 2017-12-29 LAB — MRSA PCR SCREENING: MRSA by PCR: NEGATIVE

## 2017-12-29 MED ORDER — SODIUM CHLORIDE 0.9 % IV SOLN
1500.0000 mg | Freq: Two times a day (BID) | INTRAVENOUS | Status: DC
  Filled 2017-12-29: qty 1500

## 2017-12-29 MED ORDER — FUROSEMIDE 10 MG/ML IJ SOLN
40.0000 mg | Freq: Two times a day (BID) | INTRAMUSCULAR | Status: DC
  Administered 2017-12-29: 40 mg via INTRAVENOUS
  Filled 2017-12-29: qty 4

## 2017-12-29 MED ORDER — ALTEPLASE 2 MG IJ SOLR
2.0000 mg | Freq: Once | INTRAMUSCULAR | Status: AC
  Administered 2017-12-29: 2 mg
  Filled 2017-12-29: qty 2

## 2017-12-29 MED ORDER — SODIUM CHLORIDE 0.9% FLUSH: 10.0000 mL | INTRAVENOUS | Status: DC | PRN

## 2017-12-29 MED ORDER — POTASSIUM CHLORIDE 20 MEQ/15ML (10%) PO SOLN
40.0000 meq | Freq: Two times a day (BID) | ORAL | Status: DC
  Administered 2017-12-29 (×2): 40 meq
  Filled 2017-12-29 (×2): qty 30

## 2017-12-29 MED ORDER — CHLORHEXIDINE GLUCONATE CLOTH 2 % EX PADS
6.0000 | MEDICATED_PAD | Freq: Every day | CUTANEOUS | Status: DC
  Administered 2017-12-30 – 2017-12-31 (×3): 6 via TOPICAL

## 2017-12-29 MED ORDER — SODIUM CHLORIDE 0.9% FLUSH
10.0000 mL | Freq: Two times a day (BID) | INTRAVENOUS | Status: DC
  Administered 2017-12-29 – 2018-01-02 (×7): 10 mL
  Administered 2018-01-03: 20 mL
  Administered 2018-01-03 – 2018-01-05 (×3): 10 mL
  Administered 2018-01-05: 20 mL
  Administered 2018-01-05 – 2018-01-06 (×3): 10 mL

## 2017-12-29 MED ORDER — POTASSIUM PHOSPHATES 15 MMOLE/5ML IV SOLN
10.0000 mmol | Freq: Once | INTRAVENOUS | Status: AC
  Administered 2017-12-29: 10 mmol via INTRAVENOUS
  Filled 2017-12-29: qty 3.33

## 2017-12-29 MED ORDER — DEXMEDETOMIDINE HCL IN NACL 200 MCG/50ML IV SOLN
0.4000 ug/kg/h | INTRAVENOUS | Status: DC
Start: 2017-12-29 — End: 2018-01-05
  Administered 2017-12-30: 0.6 ug/kg/h via INTRAVENOUS
  Administered 2017-12-30: 0.4 ug/kg/h via INTRAVENOUS
  Administered 2017-12-30: 0.8 ug/kg/h via INTRAVENOUS
  Administered 2017-12-30 (×3): 0.6 ug/kg/h via INTRAVENOUS
  Administered 2017-12-30: 0.8 ug/kg/h via INTRAVENOUS
  Administered 2017-12-31 (×2): 1.1 ug/kg/h via INTRAVENOUS
  Administered 2017-12-31: 1.2 ug/kg/h via INTRAVENOUS
  Administered 2017-12-31: 0.8 ug/kg/h via INTRAVENOUS
  Administered 2017-12-31: 0.9 ug/kg/h via INTRAVENOUS
  Administered 2017-12-31: 0.8 ug/kg/h via INTRAVENOUS
  Administered 2017-12-31: 1 ug/kg/h via INTRAVENOUS
  Administered 2017-12-31 (×2): 1.1 ug/kg/h via INTRAVENOUS
  Administered 2017-12-31 – 2018-01-01 (×2): 1.2 ug/kg/h via INTRAVENOUS
  Administered 2018-01-01: 1.1 ug/kg/h via INTRAVENOUS
  Administered 2018-01-01 (×5): 1.2 ug/kg/h via INTRAVENOUS
  Administered 2018-01-01: 1.1 ug/kg/h via INTRAVENOUS
  Administered 2018-01-01: 1.2 ug/kg/h via INTRAVENOUS
  Administered 2018-01-01 (×2): 1.1 ug/kg/h via INTRAVENOUS
  Administered 2018-01-01: 1.2 ug/kg/h via INTRAVENOUS
  Administered 2018-01-02: 1 ug/kg/h via INTRAVENOUS
  Administered 2018-01-02: 1.2 ug/kg/h via INTRAVENOUS
  Administered 2018-01-02: 0.4 ug/kg/h via INTRAVENOUS
  Administered 2018-01-02 (×2): 1.2 ug/kg/h via INTRAVENOUS
  Administered 2018-01-03 (×2): 0.6 ug/kg/h via INTRAVENOUS
  Administered 2018-01-03: 0.5 ug/kg/h via INTRAVENOUS
  Administered 2018-01-03 (×2): 0.8 ug/kg/h via INTRAVENOUS
  Administered 2018-01-03 – 2018-01-04 (×2): 0.9 ug/kg/h via INTRAVENOUS
  Administered 2018-01-04: 0.7 ug/kg/h via INTRAVENOUS
  Filled 2017-12-29: qty 100
  Filled 2017-12-29 (×3): qty 50
  Filled 2017-12-29: qty 100
  Filled 2017-12-29 (×3): qty 50
  Filled 2017-12-29: qty 100
  Filled 2017-12-29 (×16): qty 50
  Filled 2017-12-29: qty 100
  Filled 2017-12-29 (×3): qty 50
  Filled 2017-12-29: qty 200
  Filled 2017-12-29 (×5): qty 50
  Filled 2017-12-29: qty 150
  Filled 2017-12-29 (×4): qty 50

## 2017-12-29 MED ORDER — FUROSEMIDE 10 MG/ML IJ SOLN
80.0000 mg | Freq: Three times a day (TID) | INTRAMUSCULAR | Status: DC
  Administered 2017-12-29 – 2017-12-31 (×5): 80 mg via INTRAVENOUS
  Filled 2017-12-29 (×5): qty 8

## 2017-12-29 MED ORDER — VITAL 1.5 CAL PO LIQD
1000.0000 mL | ORAL | Status: DC
  Administered 2017-12-29 – 2017-12-30 (×2): 1000 mL
  Filled 2017-12-29 (×2): qty 1000

## 2017-12-29 MED ORDER — VANCOMYCIN HCL 10 G IV SOLR
2500.0000 mg | Freq: Once | INTRAVENOUS | Status: AC
  Administered 2017-12-29: 2500 mg via INTRAVENOUS
  Filled 2017-12-29: qty 2000
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
## 2017-12-29 NOTE — Progress Notes (Signed)
General Surgery [facility]  Assessment & Plan: Acute pancreatitis, alcohol induced Acute respiratory failure Metabolic encephalopathy  Sedated on vent  Family at bedside   Discussed briefly with Brett CanalesSteve Minor from critical care team.  No role for surgical intervention at this time.  Early signs of improvement. Will sign off.  Call if surgery can be of assistance.        Velora Hecklerodd M. Emelie Newsom, MD, [facility]       [facility]       Office: [phone]    Chief Complaint: Acute pancreatitis, abdominal pain  Subjective: Patient in ICU, on vent, family at bedside, nurse at bedside.  Awakens to stimulation.  Sedated.  Objective: Vital signs in last 24 hours: Temp:  [102 F (38.9 C)-103.5 F (39.7 C)] 102.2 F (39 C) (01/02 0600) Pulse Rate:  [102-120] 120 (01/02 0305) Resp:  [18-23] 22 (01/02 0600) BP: (107-142)/(57-103) 142/85 (01/02 0600) SpO2:  [86 %-96 %] 96 % (01/02 0600) FiO2 (%):  [50 %-60 %] 60 % (01/02 0305) Weight:  [99.7 kg (219 lb 12.8 oz)-100.1 kg (220 lb 10.9 oz)] 99.7 kg (219 lb 12.8 oz) (01/02 0500) Last BM Date: 12/22/17  Intake/Output from previous day: 01/01 0701 - 01/02 0700 In: 5000 [I.V.:4361.9; NG/GT:84.8; IV Piggyback:553.3] Out: 4250 [Urine:4250] Intake/Output this shift: No intake/output data recorded.  Physical Exam: HEENT - sclerae clear, mucous membranes moist Neck - soft, left IJ line Chest - clear bilaterally Cor - mild tachycardia Abdomen - tense, distended, few BS present Ext - moderate edema x 4 extremities  Lab Results:  Recent Labs    12/28/17 0529 12/29/17 0555  WBC 9.1 11.0*  HGB 9.4* 8.8*  HCT 27.6* 26.0*  PLT 201 292   BMET Recent Labs    12/28/17 0529 12/29/17 0555  NA 136 138  K 3.3* 3.4*  CL 99* 96*  CO2 31 34*  GLUCOSE 121* 97  BUN <5* <5*  CREATININE 0.75 0.78  CALCIUM 6.5* 7.0*   PT/INR No results for input(s): LABPROT, INR in the last 72 hours. Comprehensive Metabolic  Panel:    Component Value Date/Time   NA 138 12/29/2017 0555   NA 136 12/28/2017 0529   K 3.4 (L) 12/29/2017 0555   K 3.3 (L) 12/28/2017 0529   CL 96 (L) 12/29/2017 0555   CL 99 (L) 12/28/2017 0529   CO2 34 (H) 12/29/2017 0555   CO2 31 12/28/2017 0529   BUN <5 (L) 12/29/2017 0555   BUN <5 (L) 12/28/2017 0529   CREATININE 0.78 12/29/2017 0555   CREATININE 0.75 12/28/2017 0529   GLUCOSE 97 12/29/2017 0555   GLUCOSE 121 (H) 12/28/2017 0529   CALCIUM 7.0 (L) 12/29/2017 0555   CALCIUM 6.5 (L) 12/28/2017 0529   AST 43 (H) 12/27/2017 0410   AST 54 (H) 12/26/2017 0348   ALT 15 (L) 12/27/2017 0410   ALT 19 12/26/2017 0348   ALKPHOS 46 12/27/2017 0410   ALKPHOS 48 12/26/2017 0348   BILITOT 0.7 12/27/2017 0410   BILITOT 1.0 12/26/2017 0348   PROT 5.0 (L) 12/27/2017 0410   PROT 5.0 (L) 12/26/2017 0348   ALBUMIN 2.0 (L) 12/27/2017 0410   ALBUMIN 2.1 (L) 12/26/2017 0348    Studies/Results: Dg Abd 1 View  Result Date: 12/28/2017 CLINICAL DATA:  Check feeding catheter placement EXAM: ABDOMEN - 1 VIEW COMPARISON:  None. FINDINGS: Feeding catheter is been placed and lies within the stomach directed towards the fundus. Scattered large and small bowel  gas is noted. No bony abnormality is noted. IMPRESSION: Feeding catheter within the stomach. Electronically Signed   By: Alcide Clever M.D.   On: 12/28/2017 13:42   Dg Chest Port 1 View  Result Date: 12/29/2017 CLINICAL DATA:  Endotracheal tube position EXAM: PORTABLE CHEST 1 VIEW COMPARISON:  12/28/2017 FINDINGS: Endotracheal tube in good position. Left jugular catheter in the lower SVC unchanged. Feeding tube enters the stomach with the tip not visualized. Hypoventilation with bibasilar atelectasis and bilateral effusion unchanged. Pulmonary vascular congestion unchanged. IMPRESSION: No significant interval change. Vascular congestion with bibasilar atelectasis and effusion. Findings consistent with fluid overload. Endotracheal tube in good position.  Electronically Signed   By: Marlan Palau M.D.   On: 12/29/2017 06:41   Dg Chest Port 1 View  Result Date: 12/28/2017 CLINICAL DATA:  Respiratory failure EXAM: PORTABLE CHEST 1 VIEW COMPARISON:  12/27/2017 FINDINGS: Endotracheal tube in good position. Central line tip cavoatrial junction unchanged Hypoventilation unchanged. Bilateral pleural effusions and bibasilar atelectasis unchanged. Probable fluid overload and mild edema IMPRESSION: No significant change.  Endotracheal tube in good position. Hypoventilation with bilateral pleural effusions and bilateral atelectasis. Probable fluid overload. Electronically Signed   By: Marlan Palau M.D.   On: 12/28/2017 06:58      Shakiyah Cirilo M 12/29/2017  Patient ID: Mark Lucero, male   DOB: [birth year], 32 y.o.   MRN: [id number]

## 2017-12-29 NOTE — Progress Notes (Signed)
Pharmacy Antibiotic Note  Shaune PollackGraham A Liddicoat is a 32 y.o. male admitted on 12/23/2017 with acute pancreatitis and respiratory failure.  Pharmacy has been consulted for vancomycin dosing for possible VAP. 12/29/2017  Day # 5 merrem, adding Vanc for MRSA coverage fevers - consistently 102.2 , T max 103.1 - possible VAP due to fever and rising Fi02 need PCT 19>11; WBC up to 11; MRSA PCR negative x 2.   Plan: Vancomycin 2500 mg IV x 1 loading dose followed by vancomycin 1500 mg IV q12h for AUC est 458.5 (using SCr 0.78, Wt 99.7 kg, Ht 178, IBW/ABW) Continues on merrem 1 gm IV q8h per MD  Height: 5\' 10"  (177.8 cm) Weight: 219 lb 12.8 oz (99.7 kg) IBW/kg (Calculated) : 73  Temp (24hrs), Avg:102.5 F (39.2 C), Min:102 F (38.9 C), Max:103.5 F (39.7 C)  Recent Labs  Lab 12/23/17 2337 12/24/17 0220 12/24/17 0546 12/25/17 0634  12/26/17 0348 12/26/17 1648 12/27/17 0410 12/28/17 0529 12/29/17 0555  WBC  --  18.4*  --  11.6*  --  8.4  --  7.8 9.1 11.0*  CREATININE  --  0.75  --  1.71*   < > 1.11 0.81 0.88 0.75 0.78  LATICACIDVEN 3.0* 3.2* 2.3* 3.6*  --   --   --  1.6  --   --    < > = values in this interval not displayed.    Estimated Creatinine Clearance: 155.5 mL/min (by C-G formula based on SCr of 0.78 mg/dL).    No Known Allergies Antimicrobials this admission: 12/30 Meropenem >>  1/2 vanc>> Dose adjustments this admission:  Microbiology results: 12/29 BCx2: NGTD 12/29 MRSA PCR: negative 1/2 MRSA PCR: negative 1/2 sputum: sent  Thank you for allowing pharmacy to be a part of this patient's care.  Herby AbrahamMichelle T. Addilyne Backs, Pharm.D. [phone] 12/29/2017 2:31 PM

## 2017-12-30 ENCOUNTER — Inpatient Hospital Stay (HOSPITAL_COMMUNITY): Payer: Self-pay

## 2017-12-30 LAB — MAGNESIUM: Magnesium: 1.8 mg/dL (ref 1.7–2.4)

## 2017-12-30 LAB — CBC WITH DIFFERENTIAL/PLATELET
BASOS ABS: 0.1 10*3/uL (ref 0.0–0.1)
Basophils Relative: 1 %
EOS PCT: 1 %
Eosinophils Absolute: 0.1 10*3/uL (ref 0.0–0.7)
HEMATOCRIT: 26.8 % — AB (ref 39.0–52.0)
HEMOGLOBIN: 8.7 g/dL — AB (ref 13.0–17.0)
LYMPHS PCT: 9 %
Lymphs Abs: 1.2 10*3/uL (ref 0.7–4.0)
MCH: 33 pg (ref 26.0–34.0)
MCHC: 32.5 g/dL (ref 30.0–36.0)
MCV: 101.5 fL — AB (ref 78.0–100.0)
MONOS PCT: 10 %
Monocytes Absolute: 1.3 10*3/uL — ABNORMAL HIGH (ref 0.1–1.0)
Neutro Abs: 10.5 10*3/uL — ABNORMAL HIGH (ref 1.7–7.7)
Neutrophils Relative %: 79 %
Platelets: 356 10*3/uL (ref 150–400)
RBC: 2.64 MIL/uL — ABNORMAL LOW (ref 4.22–5.81)
RDW: 13.7 % (ref 11.5–15.5)
WBC: 13.2 10*3/uL — ABNORMAL HIGH (ref 4.0–10.5)

## 2017-12-30 LAB — GLUCOSE, CAPILLARY
GLUCOSE-CAPILLARY: 127 mg/dL — AB (ref 65–99)
GLUCOSE-CAPILLARY: 132 mg/dL — AB (ref 65–99)
Glucose-Capillary: 121 mg/dL — ABNORMAL HIGH (ref 65–99)
Glucose-Capillary: 135 mg/dL — ABNORMAL HIGH (ref 65–99)
Glucose-Capillary: 119 mg/dL — ABNORMAL HIGH (ref 65–99)
Glucose-Capillary: 86 mg/dL (ref 65–99)
Glucose-Capillary: 104 mg/dL — ABNORMAL HIGH (ref 65–99)

## 2017-12-30 LAB — BASIC METABOLIC PANEL
ANION GAP: 9 (ref 5–15)
BUN: 6 mg/dL (ref 6–20)
CALCIUM: 7.2 mg/dL — AB (ref 8.9–10.3)
CO2: 35 mmol/L — AB (ref 22–32)
Chloride: 94 mmol/L — ABNORMAL LOW (ref 101–111)
Creatinine, Ser: 0.88 mg/dL (ref 0.61–1.24)
Glucose, Bld: 134 mg/dL — ABNORMAL HIGH (ref 65–99)
POTASSIUM: 3.3 mmol/L — AB (ref 3.5–5.1)
Sodium: 138 mmol/L (ref 135–145)

## 2017-12-30 LAB — PROCALCITONIN: PROCALCITONIN: 6.05 ng/mL

## 2017-12-30 LAB — LIPASE, BLOOD: LIPASE: 42 U/L (ref 11–51)

## 2017-12-30 LAB — PHOSPHORUS: PHOSPHORUS: 1.2 mg/dL — AB (ref 2.5–4.6)

## 2017-12-30 MED ORDER — LIP MEDEX EX OINT
TOPICAL_OINTMENT | CUTANEOUS | Status: AC
  Filled 2017-12-30: qty 7

## 2017-12-30 MED ORDER — PANTOPRAZOLE SODIUM 40 MG PO PACK
40.0000 mg | PACK | Freq: Two times a day (BID) | ORAL | Status: DC
  Administered 2017-12-30 – 2018-01-02 (×6): 40 mg
  Filled 2017-12-30 (×8): qty 20

## 2017-12-30 MED ORDER — HYDROMORPHONE HCL 1 MG/ML IJ SOLN
0.5000 mg | INTRAMUSCULAR | Status: DC | PRN
  Administered 2017-12-30 – 2018-01-02 (×12): 0.5 mg via INTRAVENOUS
  Filled 2017-12-30 (×12): qty 1

## 2017-12-30 MED ORDER — POTASSIUM CHLORIDE 20 MEQ/15ML (10%) PO SOLN
40.0000 meq | Freq: Three times a day (TID) | ORAL | Status: DC
  Administered 2017-12-30 – 2018-01-01 (×5): 40 meq
  Filled 2017-12-30 (×7): qty 30

## 2017-12-30 MED ORDER — POTASSIUM PHOSPHATES 15 MMOLE/5ML IV SOLN
30.0000 mmol | Freq: Once | INTRAVENOUS | Status: AC
  Administered 2017-12-30: 30 mmol via INTRAVENOUS
  Filled 2017-12-30: qty 10
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
## 2017-12-30 NOTE — Progress Notes (Signed)
eLink Physician-Brief Progress Note Patient Name: Mark Lucero DOB: 04/11/1986 MRN: [id number]   Date of Service  12/30/2017  HPI/Events of Note    eICU Interventions  Hypokalemia/ hypophos -repleted      Intervention Category Intermediate Interventions: Electrolyte abnormality - evaluation and management  Mark Lucero 12/30/2017, 4:47 AM

## 2017-12-31 ENCOUNTER — Inpatient Hospital Stay (HOSPITAL_COMMUNITY): Payer: Self-pay

## 2017-12-31 ENCOUNTER — Encounter (HOSPITAL_COMMUNITY): Payer: Self-pay | Admitting: Radiology

## 2017-12-31 LAB — CBC WITH DIFFERENTIAL/PLATELET
Basophils Absolute: 0 10*3/uL (ref 0.0–0.1)
Basophils Relative: 0 %
EOS PCT: 2 %
Eosinophils Absolute: 0.3 10*3/uL (ref 0.0–0.7)
HEMATOCRIT: 26 % — AB (ref 39.0–52.0)
HEMOGLOBIN: 8.4 g/dL — AB (ref 13.0–17.0)
LYMPHS ABS: 1.4 10*3/uL (ref 0.7–4.0)
LYMPHS PCT: 8 %
MCH: 33.1 pg (ref 26.0–34.0)
MCHC: 32.3 g/dL (ref 30.0–36.0)
MCV: 102.4 fL — AB (ref 78.0–100.0)
MONOS PCT: 7 %
Monocytes Absolute: 1.2 10*3/uL — ABNORMAL HIGH (ref 0.1–1.0)
Neutro Abs: 14.2 10*3/uL — ABNORMAL HIGH (ref 1.7–7.7)
Neutrophils Relative %: 83 %
Platelets: 414 10*3/uL — ABNORMAL HIGH (ref 150–400)
RBC: 2.54 MIL/uL — ABNORMAL LOW (ref 4.22–5.81)
RDW: 13.6 % (ref 11.5–15.5)
WBC: 17.1 10*3/uL — AB (ref 4.0–10.5)

## 2017-12-31 LAB — GLUCOSE, CAPILLARY
GLUCOSE-CAPILLARY: 119 mg/dL — AB (ref 65–99)
GLUCOSE-CAPILLARY: 122 mg/dL — AB (ref 65–99)
Glucose-Capillary: 125 mg/dL — ABNORMAL HIGH (ref 65–99)
Glucose-Capillary: 86 mg/dL (ref 65–99)
Glucose-Capillary: 130 mg/dL — ABNORMAL HIGH (ref 65–99)
Glucose-Capillary: 107 mg/dL — ABNORMAL HIGH (ref 65–99)

## 2017-12-31 LAB — CULTURE, BLOOD (ROUTINE X 2)
CULTURE: NO GROWTH
Culture: NO GROWTH
SPECIAL REQUESTS: ADEQUATE

## 2017-12-31 LAB — PHOSPHORUS: PHOSPHORUS: 2.6 mg/dL (ref 2.5–4.6)

## 2017-12-31 LAB — COMPREHENSIVE METABOLIC PANEL
ALBUMIN: 1.9 g/dL — AB (ref 3.5–5.0)
ALK PHOS: 93 U/L (ref 38–126)
ALT: 50 U/L (ref 17–63)
AST: 85 U/L — AB (ref 15–41)
Anion gap: 8 (ref 5–15)
BUN: 12 mg/dL (ref 6–20)
CHLORIDE: 95 mmol/L — AB (ref 101–111)
CO2: 37 mmol/L — AB (ref 22–32)
CREATININE: 0.82 mg/dL (ref 0.61–1.24)
Calcium: 7.6 mg/dL — ABNORMAL LOW (ref 8.9–10.3)
GFR calc Af Amer: >60 mL/min (ref >60)
GFR calc non Af Amer: >60 mL/min (ref >60)
GLUCOSE: 124 mg/dL — AB (ref 65–99)
Potassium: 3.8 mmol/L (ref 3.5–5.1)
Sodium: 140 mmol/L (ref 135–145)
Total Bilirubin: 0.7 mg/dL (ref 0.3–1.2)
Total Protein: 5.7 g/dL — ABNORMAL LOW (ref 6.5–8.1)

## 2017-12-31 LAB — CULTURE, RESPIRATORY W GRAM STAIN: Gram Stain: NONE SEEN

## 2017-12-31 LAB — CULTURE, RESPIRATORY
CULTURE: NO GROWTH
SPECIAL REQUESTS: NORMAL

## 2017-12-31 LAB — MAGNESIUM: Magnesium: 1.7 mg/dL (ref 1.7–2.4)

## 2017-12-31 MED ORDER — IOPAMIDOL (ISOVUE-370) INJECTION 76%
INTRAVENOUS | Status: AC
  Administered 2017-12-31: 100 mL via INTRAVENOUS
  Filled 2017-12-31: qty 100

## 2017-12-31 MED ORDER — MAGNESIUM SULFATE 2 GM/50ML IV SOLN
2.0000 g | Freq: Once | INTRAVENOUS | Status: AC
  Administered 2017-12-31: 2 g via INTRAVENOUS
  Filled 2017-12-31: qty 50

## 2017-12-31 MED ORDER — ENOXAPARIN SODIUM 40 MG/0.4ML ~~LOC~~ SOLN
40.0000 mg | Freq: Every day | SUBCUTANEOUS | Status: DC
  Administered 2018-01-01 – 2018-01-06 (×6): 40 mg via SUBCUTANEOUS
  Filled 2017-12-31 (×7): qty 0.4

## 2017-12-31 MED ORDER — VITAL 1.5 CAL PO LIQD
1000.0000 mL | ORAL | Status: DC
  Administered 2017-12-31: 1000 mL
  Filled 2017-12-31: qty 1000

## 2017-12-31 NOTE — Progress Notes (Signed)
eLink Physician-Brief Progress Note Patient Name: Mark PollackGraham A Nida DOB: 05-29-1986 MRN: [id number]   Date of Service  12/31/2017  HPI/Events of Note  Needs DVT prophylaxis plt stable, hgb stable  eICU Interventions  Add back lovnenox     Intervention Category Intermediate Interventions: Best-practice therapies (e.g. DVT, beta blocker, etc.)  Max FickleDouglas Towanda Hornstein 12/31/2017, 8:03 PM

## 2017-12-31 NOTE — Progress Notes (Signed)
eLink Physician-Brief Progress Note Patient Name: Mark Lucero DOB: June 19, 1986 MRN: [id number]   Date of Service  12/31/2017  HPI/Events of Note  Vomited, on tube feeding  eICU Interventions  Hold tube feeding overnight, G tube to low wall suction, re-evaluate in AM     Intervention Category Intermediate Interventions: Communication with other healthcare providers and/or family  [name] 12/31/2017, 10:29 PM
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
## 2018-01-01 ENCOUNTER — Inpatient Hospital Stay (HOSPITAL_COMMUNITY): Payer: Self-pay

## 2018-01-01 DIAGNOSIS — R55 Syncope and collapse: Secondary | ICD-10-CM

## 2018-01-01 DIAGNOSIS — R52 Pain, unspecified: Secondary | ICD-10-CM

## 2018-01-01 LAB — BASIC METABOLIC PANEL
ANION GAP: 10 (ref 5–15)
BUN: 15 mg/dL (ref 6–20)
CHLORIDE: 96 mmol/L — AB (ref 101–111)
CO2: 37 mmol/L — AB (ref 22–32)
Calcium: 8.8 mg/dL — ABNORMAL LOW (ref 8.9–10.3)
Creatinine, Ser: 0.85 mg/dL (ref 0.61–1.24)
GFR calc Af Amer: >60 mL/min (ref >60)
GFR calc non Af Amer: >60 mL/min (ref >60)
GLUCOSE: 137 mg/dL — AB (ref 65–99)
Potassium: 4.1 mmol/L (ref 3.5–5.1)
Sodium: 143 mmol/L (ref 135–145)

## 2018-01-01 LAB — CBC WITH DIFFERENTIAL/PLATELET
Basophils Absolute: 0.1 10*3/uL (ref 0.0–0.1)
Basophils Relative: 0 %
Eosinophils Absolute: 0.1 10*3/uL (ref 0.0–0.7)
Eosinophils Relative: 1 %
HEMATOCRIT: 25.8 % — AB (ref 39.0–52.0)
HEMOGLOBIN: 8.2 g/dL — AB (ref 13.0–17.0)
LYMPHS ABS: 1.2 10*3/uL (ref 0.7–4.0)
Lymphocytes Relative: 6 %
MCH: 32.7 pg (ref 26.0–34.0)
MCHC: 31.8 g/dL (ref 30.0–36.0)
MCV: 102.8 fL — ABNORMAL HIGH (ref 78.0–100.0)
MONOS PCT: 6 %
Monocytes Absolute: 1.3 10*3/uL — ABNORMAL HIGH (ref 0.1–1.0)
NEUTROS ABS: 17.4 10*3/uL — AB (ref 1.7–7.7)
Neutrophils Relative %: 87 %
Platelets: 565 10*3/uL — ABNORMAL HIGH (ref 150–400)
RBC: 2.51 MIL/uL — AB (ref 4.22–5.81)
RDW: 13.4 % (ref 11.5–15.5)
WBC: 20.1 10*3/uL — AB (ref 4.0–10.5)

## 2018-01-01 LAB — GLUCOSE, CAPILLARY
GLUCOSE-CAPILLARY: 108 mg/dL — AB (ref 65–99)
GLUCOSE-CAPILLARY: 114 mg/dL — AB (ref 65–99)
GLUCOSE-CAPILLARY: 122 mg/dL — AB (ref 65–99)
Glucose-Capillary: 121 mg/dL — ABNORMAL HIGH (ref 65–99)
Glucose-Capillary: 106 mg/dL — ABNORMAL HIGH (ref 65–99)
Glucose-Capillary: 117 mg/dL — ABNORMAL HIGH (ref 65–99)

## 2018-01-01 LAB — COMPREHENSIVE METABOLIC PANEL
ALBUMIN: 2 g/dL — AB (ref 3.5–5.0)
ALT: 50 U/L (ref 17–63)
AST: 64 U/L — AB (ref 15–41)
Alkaline Phosphatase: 93 U/L (ref 38–126)
Anion gap: 7 (ref 5–15)
BILIRUBIN TOTAL: 0.5 mg/dL (ref 0.3–1.2)
BUN: 13 mg/dL (ref 6–20)
CHLORIDE: 101 mmol/L (ref 101–111)
CO2: 35 mmol/L — ABNORMAL HIGH (ref 22–32)
CREATININE: 0.74 mg/dL (ref 0.61–1.24)
Calcium: 8.2 mg/dL — ABNORMAL LOW (ref 8.9–10.3)
GFR calc Af Amer: >60 mL/min (ref >60)
GFR calc non Af Amer: >60 mL/min (ref >60)
GLUCOSE: 115 mg/dL — AB (ref 65–99)
POTASSIUM: 4.4 mmol/L (ref 3.5–5.1)
Sodium: 143 mmol/L (ref 135–145)
Total Protein: 5.7 g/dL — ABNORMAL LOW (ref 6.5–8.1)

## 2018-01-01 LAB — MAGNESIUM: Magnesium: 2.2 mg/dL (ref 1.7–2.4)

## 2018-01-01 LAB — PROCALCITONIN: PROCALCITONIN: 2.35 ng/mL

## 2018-01-01 LAB — PHOSPHORUS: PHOSPHORUS: 2.8 mg/dL (ref 2.5–4.6)

## 2018-01-01 MED ORDER — FUROSEMIDE 10 MG/ML IJ SOLN
80.0000 mg | INTRAMUSCULAR | Status: DC
  Administered 2018-01-01 – 2018-01-04 (×20): 80 mg via INTRAVENOUS
  Filled 2018-01-01 (×20): qty 8

## 2018-01-01 MED ORDER — LORAZEPAM 2 MG/ML IJ SOLN
0.5000 mg | INTRAMUSCULAR | Status: DC | PRN
  Administered 2018-01-01 (×4): 1 mg via INTRAVENOUS
  Administered 2018-01-02: 0.5 mg via INTRAVENOUS
  Administered 2018-01-02: 1 mg via INTRAVENOUS
  Filled 2018-01-01 (×7): qty 1

## 2018-01-01 MED ORDER — CHLORHEXIDINE GLUCONATE CLOTH 2 % EX PADS
6.0000 | MEDICATED_PAD | Freq: Every day | CUTANEOUS | Status: DC
  Administered 2018-01-01 – 2018-01-02 (×2): 6 via TOPICAL

## 2018-01-01 MED ORDER — POTASSIUM CHLORIDE 20 MEQ/15ML (10%) PO SOLN
20.0000 meq | ORAL | Status: DC
  Administered 2018-01-01 – 2018-01-02 (×9): 20 meq
  Filled 2018-01-01 (×9): qty 15
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
## 2018-01-01 NOTE — Progress Notes (Signed)
eLink Physician-Brief Progress Note Patient Name: Mark Lucero DOB: [birth year] MRN: [id number]   Date of Service  01/01/2018  HPI/Events of Note    eICU Interventions  Prn ativan     Intervention Category Minor Interventions: Agitation / anxiety - evaluation and management  Max FickleDouglas McQuaid 01/01/2018, 12:22 AM

## 2018-01-01 NOTE — Progress Notes (Signed)
Bilateral lower extremity venous duplex has been completed. Negative for DVT.  01/01/18 11:12 AM Olen CordialGreg Mylasia Vorhees RVT

## 2018-01-01 NOTE — Progress Notes (Signed)
eLink Physician-Brief Progress Note Patient Name: Mark Lucero DOB: 01-Feb-1986 MRN: [id number]   Date of Service  01/01/2018  HPI/Events of Note    eICU Interventions  Medical restraints ordered     Intervention Category Minor Interventions: Agitation / anxiety - evaluation and management  Leslye PeerRobert S Chanceler Pullin 01/01/2018, 7:17 PM

## 2018-01-01 NOTE — Progress Notes (Signed)
  Echocardiogram 2D Echocardiogram has been performed.  Roosvelt MaserLane, Kinzley Savell F 01/01/2018, 11:59 AM

## 2018-01-02 ENCOUNTER — Inpatient Hospital Stay (HOSPITAL_COMMUNITY): Payer: Self-pay

## 2018-01-02 LAB — GLUCOSE, CAPILLARY
GLUCOSE-CAPILLARY: 132 mg/dL — AB (ref 65–99)
GLUCOSE-CAPILLARY: 139 mg/dL — AB (ref 65–99)
Glucose-Capillary: 131 mg/dL — ABNORMAL HIGH (ref 65–99)
Glucose-Capillary: 123 mg/dL — ABNORMAL HIGH (ref 65–99)
Glucose-Capillary: 122 mg/dL — ABNORMAL HIGH (ref 65–99)
Glucose-Capillary: 128 mg/dL — ABNORMAL HIGH (ref 65–99)

## 2018-01-02 LAB — CBC WITH DIFFERENTIAL/PLATELET
BASOS ABS: 0 10*3/uL (ref 0.0–0.1)
Basophils Relative: 0 %
EOS ABS: 0.2 10*3/uL (ref 0.0–0.7)
Eosinophils Relative: 1 %
HCT: 26.5 % — ABNORMAL LOW (ref 39.0–52.0)
HEMOGLOBIN: 8.6 g/dL — AB (ref 13.0–17.0)
LYMPHS PCT: 6 %
Lymphs Abs: 1.3 10*3/uL (ref 0.7–4.0)
MCH: 33.2 pg (ref 26.0–34.0)
MCHC: 32.5 g/dL (ref 30.0–36.0)
MCV: 102.3 fL — ABNORMAL HIGH (ref 78.0–100.0)
MONOS PCT: 6 %
Monocytes Absolute: 1.3 10*3/uL — ABNORMAL HIGH (ref 0.1–1.0)
NEUTROS ABS: 19.4 10*3/uL — AB (ref 1.7–7.7)
NEUTROS PCT: 87 %
PLATELETS: 710 10*3/uL — AB (ref 150–400)
RBC: 2.59 MIL/uL — AB (ref 4.22–5.81)
RDW: 13.5 % (ref 11.5–15.5)
WBC: 22.2 10*3/uL — AB (ref 4.0–10.5)

## 2018-01-02 LAB — BASIC METABOLIC PANEL
Anion gap: 12 (ref 5–15)
BUN: 17 mg/dL (ref 6–20)
CHLORIDE: 95 mmol/L — AB (ref 101–111)
CO2: 34 mmol/L — AB (ref 22–32)
Calcium: 8.6 mg/dL — ABNORMAL LOW (ref 8.9–10.3)
Creatinine, Ser: 0.83 mg/dL (ref 0.61–1.24)
GFR calc non Af Amer: >60 mL/min (ref >60)
Glucose, Bld: 132 mg/dL — ABNORMAL HIGH (ref 65–99)
POTASSIUM: 3.6 mmol/L (ref 3.5–5.1)
Sodium: 141 mmol/L (ref 135–145)

## 2018-01-02 LAB — MAGNESIUM: MAGNESIUM: 2.3 mg/dL (ref 1.7–2.4)

## 2018-01-02 LAB — PHOSPHORUS: PHOSPHORUS: 2.9 mg/dL (ref 2.5–4.6)

## 2018-01-02 MED ORDER — IBUPROFEN 100 MG/5ML PO SUSP
400.0000 mg | Freq: Four times a day (QID) | ORAL | Status: DC | PRN
  Administered 2018-01-02: 400 mg via ORAL
  Filled 2018-01-02: qty 20

## 2018-01-02 MED ORDER — ORAL CARE MOUTH RINSE
15.0000 mL | Freq: Two times a day (BID) | OROMUCOSAL | Status: DC
  Administered 2018-01-02 – 2018-01-03 (×3): 15 mL via OROMUCOSAL

## 2018-01-02 MED ORDER — DEXTROSE-NACL 5-0.45 % IV SOLN
INTRAVENOUS | Status: DC
  Administered 2018-01-02: 13:00:00 via INTRAVENOUS

## 2018-01-02 MED ORDER — POTASSIUM CHLORIDE 20 MEQ/15ML (10%) PO SOLN
40.0000 meq | Freq: Once | ORAL | Status: AC
  Administered 2018-01-02: 40 meq
  Filled 2018-01-02: qty 30

## 2018-01-02 MED ORDER — CHLORHEXIDINE GLUCONATE 0.12 % MT SOLN: 15.0000 mL | Freq: Two times a day (BID) | OROMUCOSAL | Status: DC

## 2018-01-02 MED ORDER — PANTOPRAZOLE SODIUM 40 MG PO PACK
40.0000 mg | PACK | Freq: Two times a day (BID) | ORAL | Status: DC
  Administered 2018-01-02: 40 mg via ORAL
  Filled 2018-01-02: qty 20
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
## 2018-01-02 NOTE — Progress Notes (Signed)
eLink Physician-Brief Progress Note Patient Name: Mark Lucero DOB: 16-Jun-1986 MRN: [id number]   Date of Service  01/02/2018  HPI/Events of Note  Pain - Creatinine = 0.83.  eICU Interventions  Will order: 1. Motrin liquid 400 mg PP Q 6 hours PRN pain.      Intervention Category Major Interventions: Other:  Mark Lucero,Mark Lucero 01/02/2018, 8:41 PM

## 2018-01-02 NOTE — Progress Notes (Signed)
Patient pulled out his PANDA tube. Has put out 25 ml this shift through tube. No c/o abd pain or nausea. Will continue to monitor.

## 2018-01-02 NOTE — Procedures (Signed)
Extubation Procedure Note  Patient Details:   Name: Mark Lucero DOB: 17-Nov-1986 MRN: [id number]   Airway Documentation:     Evaluation  O2 sats: 92 Complications: none Patient tolerated procedure well. Bilateral Breath Sounds: Clear   Pt able to speak  Per CCM order, pt extubated and placed on 3L nasal cannula  Anvita Hirata S 01/02/2018, 11:45 AM

## 2018-01-03 LAB — CBC WITH DIFFERENTIAL/PLATELET
BASOS PCT: 0 %
Basophils Absolute: 0 10*3/uL (ref 0.0–0.1)
EOS ABS: 0 10*3/uL (ref 0.0–0.7)
Eosinophils Relative: 0 %
HCT: 26.6 % — ABNORMAL LOW (ref 39.0–52.0)
Hemoglobin: 8.5 g/dL — ABNORMAL LOW (ref 13.0–17.0)
LYMPHS PCT: 9 %
Lymphs Abs: 1.7 10*3/uL (ref 0.7–4.0)
MCH: 32.1 pg (ref 26.0–34.0)
MCHC: 32 g/dL (ref 30.0–36.0)
MCV: 100.4 fL — ABNORMAL HIGH (ref 78.0–100.0)
MONO ABS: 1.1 10*3/uL — AB (ref 0.1–1.0)
Monocytes Relative: 6 %
NEUTROS PCT: 85 %
Neutro Abs: 16 10*3/uL — ABNORMAL HIGH (ref 1.7–7.7)
PLATELETS: 807 10*3/uL — AB (ref 150–400)
RBC: 2.65 MIL/uL — AB (ref 4.22–5.81)
RDW: 13.4 % (ref 11.5–15.5)
WBC: 18.8 10*3/uL — AB (ref 4.0–10.5)

## 2018-01-03 LAB — GLUCOSE, CAPILLARY
GLUCOSE-CAPILLARY: 103 mg/dL — AB (ref 65–99)
GLUCOSE-CAPILLARY: 160 mg/dL — AB (ref 65–99)
GLUCOSE-CAPILLARY: 99 mg/dL (ref 65–99)
Glucose-Capillary: 125 mg/dL — ABNORMAL HIGH (ref 65–99)
Glucose-Capillary: 106 mg/dL — ABNORMAL HIGH (ref 65–99)
Glucose-Capillary: 114 mg/dL — ABNORMAL HIGH (ref 65–99)

## 2018-01-03 LAB — BASIC METABOLIC PANEL
ANION GAP: 11 (ref 5–15)
BUN: 19 mg/dL (ref 6–20)
CO2: 35 mmol/L — AB (ref 22–32)
CREATININE: 0.91 mg/dL (ref 0.61–1.24)
Calcium: 8.4 mg/dL — ABNORMAL LOW (ref 8.9–10.3)
Chloride: 97 mmol/L — ABNORMAL LOW (ref 101–111)
GFR calc non Af Amer: >60 mL/min (ref >60)
Glucose, Bld: 123 mg/dL — ABNORMAL HIGH (ref 65–99)
Potassium: 3 mmol/L — ABNORMAL LOW (ref 3.5–5.1)
SODIUM: 143 mmol/L (ref 135–145)

## 2018-01-03 LAB — MAGNESIUM: MAGNESIUM: 2.3 mg/dL (ref 1.7–2.4)

## 2018-01-03 LAB — PHOSPHORUS: PHOSPHORUS: 4.2 mg/dL (ref 2.5–4.6)

## 2018-01-03 MED ORDER — ONDANSETRON HCL 4 MG/2ML IJ SOLN
4.0000 mg | Freq: Four times a day (QID) | INTRAMUSCULAR | Status: DC
  Administered 2018-01-03 – 2018-01-06 (×11): 4 mg via INTRAVENOUS
  Filled 2018-01-03 (×10): qty 2

## 2018-01-03 MED ORDER — POTASSIUM CHLORIDE CRYS ER 20 MEQ PO TBCR
30.0000 meq | EXTENDED_RELEASE_TABLET | ORAL | Status: AC
  Administered 2018-01-03 (×2): 30 meq via ORAL
  Filled 2018-01-03 (×2): qty 1

## 2018-01-03 MED ORDER — BOOST PLUS PO LIQD
237.0000 mL | Freq: Two times a day (BID) | ORAL | Status: DC
  Administered 2018-01-04 – 2018-01-07 (×4): 237 mL via ORAL
  Filled 2018-01-03 (×9): qty 237

## 2018-01-03 MED ORDER — ONDANSETRON HCL 4 MG/2ML IJ SOLN
INTRAMUSCULAR | Status: AC
  Administered 2018-01-03: 4 mg via INTRAVENOUS
  Filled 2018-01-03: qty 2

## 2018-01-03 MED ORDER — ADULT MULTIVITAMIN W/MINERALS CH
1.0000 | ORAL_TABLET | Freq: Every day | ORAL | Status: DC
  Administered 2018-01-04 – 2018-01-06 (×2): 1 via ORAL
  Filled 2018-01-03 (×3): qty 1

## 2018-01-03 MED ORDER — UNJURY CHICKEN SOUP POWDER
8.0000 [oz_av] | Freq: Every day | ORAL | Status: DC
  Administered 2018-01-04: 8 [oz_av] via ORAL
  Filled 2018-01-03 (×5): qty 27

## 2018-01-03 MED ORDER — ALPRAZOLAM 0.5 MG PO TABS
0.5000 mg | ORAL_TABLET | Freq: Once | ORAL | Status: AC
  Administered 2018-01-03: 0.5 mg via ORAL
  Filled 2018-01-03: qty 1

## 2018-01-03 NOTE — Progress Notes (Signed)
eLink Physician-Brief Progress Note Patient Name: Mark Lucero DOB: 1986/06/29 MRN: [id number]   Date of Service  01/03/2018  HPI/Events of Note  Anxiety - Patient requests home Xanax.   eICU Interventions  Will order Xanax 0.5 mg PO X 1 now.      Intervention Category Minor Interventions: Agitation / anxiety - evaluation and management  Yair Dusza Eugene 01/03/2018, 8:10 PM

## 2018-01-03 NOTE — Progress Notes (Signed)
[facility]AC[facility] ONLY  The patient does [facility] based on the criteria listed below:   1. Is GFR >/= 40 ml/min? Yes.    Patient's GFR today is >60 2. Is urine output >/= 0.5 ml/kg/hr for the last 6 hours? Yes.   Patient's UOP is 2.2 ml/kg/hr 3. Is BUN < 60 mg/dL? Yes.    Patient's BUN today is 19 4. Abnormal electrolyte(s): K+=3.0 5. Ordered repletion with: per protocol 6. If a panic level lab has been reported, has the CCM MD in charge been notified? Yes.  .   Physician:  Gale JourneySommer  Miasia Crabtree Parker 01/03/2018 6:11 AM
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
## 2018-01-04 ENCOUNTER — Inpatient Hospital Stay (HOSPITAL_COMMUNITY): Payer: Self-pay

## 2018-01-04 LAB — CBC WITH DIFFERENTIAL/PLATELET
BASOS ABS: 0.1 10*3/uL (ref 0.0–0.1)
BASOS PCT: 0 %
EOS PCT: 1 %
Eosinophils Absolute: 0.1 10*3/uL (ref 0.0–0.7)
HEMATOCRIT: 31.7 % — AB (ref 39.0–52.0)
Hemoglobin: 10.6 g/dL — ABNORMAL LOW (ref 13.0–17.0)
LYMPHS PCT: 10 %
Lymphs Abs: 2.2 10*3/uL (ref 0.7–4.0)
MCH: 33.4 pg (ref 26.0–34.0)
MCHC: 33.4 g/dL (ref 30.0–36.0)
MCV: 100 fL (ref 78.0–100.0)
MONO ABS: 1.1 10*3/uL — AB (ref 0.1–1.0)
MONOS PCT: 5 %
NEUTROS ABS: 18 10*3/uL — AB (ref 1.7–7.7)
Neutrophils Relative %: 84 %
PLATELETS: 890 10*3/uL — AB (ref 150–400)
RBC: 3.17 MIL/uL — ABNORMAL LOW (ref 4.22–5.81)
RDW: 12.9 % (ref 11.5–15.5)
WBC: 21.5 10*3/uL — ABNORMAL HIGH (ref 4.0–10.5)

## 2018-01-04 LAB — BASIC METABOLIC PANEL
Anion gap: 13 (ref 5–15)
BUN: 19 mg/dL (ref 6–20)
CHLORIDE: 91 mmol/L — AB (ref 101–111)
CO2: 33 mmol/L — ABNORMAL HIGH (ref 22–32)
CREATININE: 1 mg/dL (ref 0.61–1.24)
Calcium: 8.7 mg/dL — ABNORMAL LOW (ref 8.9–10.3)
GFR calc Af Amer: >60 mL/min (ref >60)
GFR calc non Af Amer: >60 mL/min (ref >60)
GLUCOSE: 107 mg/dL — AB (ref 65–99)
POTASSIUM: 3.1 mmol/L — AB (ref 3.5–5.1)
SODIUM: 137 mmol/L (ref 135–145)

## 2018-01-04 LAB — MAGNESIUM: Magnesium: 2.1 mg/dL (ref 1.7–2.4)

## 2018-01-04 LAB — GLUCOSE, CAPILLARY
GLUCOSE-CAPILLARY: 115 mg/dL — AB (ref 65–99)
GLUCOSE-CAPILLARY: 103 mg/dL — AB (ref 65–99)
GLUCOSE-CAPILLARY: 103 mg/dL — AB (ref 65–99)
Glucose-Capillary: 101 mg/dL — ABNORMAL HIGH (ref 65–99)
Glucose-Capillary: 91 mg/dL (ref 65–99)
Glucose-Capillary: 102 mg/dL — ABNORMAL HIGH (ref 65–99)

## 2018-01-04 LAB — PHOSPHORUS: Phosphorus: 3.2 mg/dL (ref 2.5–4.6)

## 2018-01-04 MED ORDER — ORAL CARE MOUTH RINSE
15.0000 mL | Freq: Two times a day (BID) | OROMUCOSAL | Status: DC
  Administered 2018-01-04 – 2018-01-07 (×7): 15 mL via OROMUCOSAL

## 2018-01-04 MED ORDER — POTASSIUM CHLORIDE CRYS ER 20 MEQ PO TBCR
40.0000 meq | EXTENDED_RELEASE_TABLET | ORAL | Status: AC
Start: 2018-01-04 — End: 2018-01-04
  Administered 2018-01-04 (×2): 40 meq via ORAL
  Filled 2018-01-04 (×2): qty 2

## 2018-01-04 MED ORDER — FUROSEMIDE 10 MG/ML IJ SOLN
80.0000 mg | Freq: Three times a day (TID) | INTRAMUSCULAR | Status: DC
  Administered 2018-01-04 – 2018-01-06 (×5): 80 mg via INTRAVENOUS
  Filled 2018-01-04 (×5): qty 8

## 2018-01-04 MED ORDER — POTASSIUM CHLORIDE 10 MEQ/100ML IV SOLN
10.0000 meq | INTRAVENOUS | Status: DC
  Administered 2018-01-04: 10 meq via INTRAVENOUS
  Filled 2018-01-04: qty 100

## 2018-01-04 MED ORDER — ALPRAZOLAM 1 MG PO TABS
1.0000 mg | ORAL_TABLET | Freq: Two times a day (BID) | ORAL | Status: DC | PRN
  Administered 2018-01-04 – 2018-01-06 (×4): 1 mg via ORAL
  Filled 2018-01-04 (×2): qty 1
  Filled 2018-01-04: qty 2
  Filled 2018-01-04: qty 1

## 2018-01-04 MED ORDER — ALPRAZOLAM 1 MG PO TABS
1.0000 mg | ORAL_TABLET | Freq: Every evening | ORAL | Status: DC | PRN
  Administered 2018-01-04: 1 mg via ORAL
  Filled 2018-01-04: qty 1

## 2018-01-04 NOTE — Progress Notes (Signed)
[facility] FOR [facility] LAB REPLACEMENT ONLY  The patient does [facility] Protocol based on the criteria listed below:   1. Is GFR >/= 40 ml/min? Yes.    Patient's GFR today is 60 2. Is urine output >/= 0.5 ml/kg/hr for the last 6 hours? Yes.   Patient's UOP is 1.6 ml/kg/hr 3. Is BUN < 60 mg/dL? Yes.    Patient's BUN today is 19 4. Abnormal electrolyte K+=3.0 5. Ordered repletion with: per protocol 6. If a panic level lab has been reported, has the CCM MD in charge been notified? Yes.  .   Physician:  Gale JourneySommer  Lile Mccurley Parker 01/04/2018 6:45 AM
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
## 2018-01-05 LAB — BASIC METABOLIC PANEL
Anion gap: 12 (ref 5–15)
BUN: 17 mg/dL (ref 6–20)
CALCIUM: 8.7 mg/dL — AB (ref 8.9–10.3)
CO2: 31 mmol/L (ref 22–32)
CREATININE: 1.03 mg/dL (ref 0.61–1.24)
Chloride: 93 mmol/L — ABNORMAL LOW (ref 101–111)
GFR calc Af Amer: >60 mL/min (ref >60)
GFR calc non Af Amer: >60 mL/min (ref >60)
GLUCOSE: 115 mg/dL — AB (ref 65–99)
Potassium: 2.9 mmol/L — ABNORMAL LOW (ref 3.5–5.1)
Sodium: 136 mmol/L (ref 135–145)

## 2018-01-05 LAB — PATHOLOGIST SMEAR REVIEW

## 2018-01-05 LAB — CBC WITH DIFFERENTIAL/PLATELET
Basophils Absolute: 0 10*3/uL (ref 0.0–0.1)
Basophils Relative: 0 %
EOS ABS: 0 10*3/uL (ref 0.0–0.7)
EOS PCT: 0 %
HCT: 30.6 % — ABNORMAL LOW (ref 39.0–52.0)
Hemoglobin: 10.4 g/dL — ABNORMAL LOW (ref 13.0–17.0)
LYMPHS ABS: 2.2 10*3/uL (ref 0.7–4.0)
Lymphocytes Relative: 10 %
MCH: 33.3 pg (ref 26.0–34.0)
MCHC: 34 g/dL (ref 30.0–36.0)
MCV: 98.1 fL (ref 78.0–100.0)
MONO ABS: 1.3 10*3/uL — AB (ref 0.1–1.0)
MONOS PCT: 6 %
NEUTROS ABS: 18.9 10*3/uL — AB (ref 1.7–7.7)
Neutrophils Relative %: 84 %
PLATELETS: 937 10*3/uL — AB (ref 150–400)
RBC: 3.12 MIL/uL — ABNORMAL LOW (ref 4.22–5.81)
RDW: 13.2 % (ref 11.5–15.5)
WBC: 22.4 10*3/uL — AB (ref 4.0–10.5)

## 2018-01-05 LAB — MAGNESIUM: MAGNESIUM: 2.2 mg/dL (ref 1.7–2.4)

## 2018-01-05 LAB — GLUCOSE, CAPILLARY
GLUCOSE-CAPILLARY: 113 mg/dL — AB (ref 65–99)
Glucose-Capillary: 120 mg/dL — ABNORMAL HIGH (ref 65–99)

## 2018-01-05 LAB — PHOSPHORUS: Phosphorus: 3.3 mg/dL (ref 2.5–4.6)

## 2018-01-05 MED ORDER — POTASSIUM CHLORIDE CRYS ER 20 MEQ PO TBCR
40.0000 meq | EXTENDED_RELEASE_TABLET | ORAL | Status: AC
  Administered 2018-01-05 (×3): 40 meq via ORAL
  Filled 2018-01-05 (×3): qty 2

## 2018-01-05 MED ORDER — LORAZEPAM 2 MG/ML IJ SOLN: 1.0000 mg | Freq: Once | INTRAMUSCULAR | Status: DC
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
## 2018-01-05 NOTE — Progress Notes (Signed)
Pt admitted to floor, no complaints at this time. Belongings at bedside.

## 2018-01-06 LAB — CBC
HCT: 30.2 % — ABNORMAL LOW (ref 39.0–52.0)
HEMOGLOBIN: 10 g/dL — AB (ref 13.0–17.0)
MCH: 32.6 pg (ref 26.0–34.0)
MCHC: 33.1 g/dL (ref 30.0–36.0)
MCV: 98.4 fL (ref 78.0–100.0)
PLATELETS: 986 10*3/uL — AB (ref 150–400)
RBC: 3.07 MIL/uL — AB (ref 4.22–5.81)
RDW: 13 % (ref 11.5–15.5)
WBC: 23 10*3/uL — AB (ref 4.0–10.5)

## 2018-01-06 LAB — MAGNESIUM: MAGNESIUM: 2.1 mg/dL (ref 1.7–2.4)

## 2018-01-06 LAB — BASIC METABOLIC PANEL
ANION GAP: 12 (ref 5–15)
BUN: 16 mg/dL (ref 6–20)
CALCIUM: 8.8 mg/dL — AB (ref 8.9–10.3)
CO2: 29 mmol/L (ref 22–32)
Chloride: 93 mmol/L — ABNORMAL LOW (ref 101–111)
Creatinine, Ser: 0.97 mg/dL (ref 0.61–1.24)
GFR calc non Af Amer: >60 mL/min (ref >60)
Glucose, Bld: 114 mg/dL — ABNORMAL HIGH (ref 65–99)
POTASSIUM: 3.4 mmol/L — AB (ref 3.5–5.1)
Sodium: 134 mmol/L — ABNORMAL LOW (ref 135–145)

## 2018-01-06 MED ORDER — POTASSIUM CHLORIDE CRYS ER 20 MEQ PO TBCR
40.0000 meq | EXTENDED_RELEASE_TABLET | Freq: Once | ORAL | Status: AC
  Administered 2018-01-06: 40 meq via ORAL
  Filled 2018-01-06: qty 2
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
## 2018-01-06 NOTE — Progress Notes (Signed)
PT Discharge Note  Patient Details Name: Shaune PollackGraham A Clarey MRN: [id number] DOB: May 24, 1986   Cancelled Treatment:    Reason Eval/Treat Not Completed: PT screened, no needs identified, will sign off(pt/family report he is independent with mobility, he has been walking in the halls and showering independently, no PT needed, will sign off. )   Tamala SerUhlenberg, Kati Riggenbach Kistler 01/06/2018, 2:31 PM [phone]

## 2018-01-07 ENCOUNTER — Telehealth: Payer: Self-pay | Admitting: General Practice

## 2018-01-07 DIAGNOSIS — R6510 Systemic inflammatory response syndrome (SIRS) of non-infectious origin without acute organ dysfunction: Secondary | ICD-10-CM

## 2018-01-07 LAB — BASIC METABOLIC PANEL
Anion gap: 12 (ref 5–15)
BUN: 12 mg/dL (ref 6–20)
CHLORIDE: 92 mmol/L — AB (ref 101–111)
CO2: 29 mmol/L (ref 22–32)
Calcium: 8.6 mg/dL — ABNORMAL LOW (ref 8.9–10.3)
Creatinine, Ser: 0.96 mg/dL (ref 0.61–1.24)
GFR calc Af Amer: >60 mL/min (ref >60)
GFR calc non Af Amer: >60 mL/min (ref >60)
GLUCOSE: 109 mg/dL — AB (ref 65–99)
POTASSIUM: 3.2 mmol/L — AB (ref 3.5–5.1)
Sodium: 133 mmol/L — ABNORMAL LOW (ref 135–145)

## 2018-01-07 LAB — CBC
HEMATOCRIT: 29.1 % — AB (ref 39.0–52.0)
HEMOGLOBIN: 9.7 g/dL — AB (ref 13.0–17.0)
MCH: 32.7 pg (ref 26.0–34.0)
MCHC: 33.3 g/dL (ref 30.0–36.0)
MCV: 98 fL (ref 78.0–100.0)
Platelets: 947 10*3/uL (ref 150–400)
RBC: 2.97 MIL/uL — ABNORMAL LOW (ref 4.22–5.81)
RDW: 12.9 % (ref 11.5–15.5)
WBC: 20.2 10*3/uL — ABNORMAL HIGH (ref 4.0–10.5)

## 2018-01-07 LAB — MAGNESIUM: Magnesium: 2.1 mg/dL (ref 1.7–2.4)

## 2018-01-07 MED ORDER — POTASSIUM CHLORIDE CRYS ER 20 MEQ PO TBCR
40.0000 meq | EXTENDED_RELEASE_TABLET | Freq: Once | ORAL | Status: AC
  Administered 2018-01-07: 40 meq via ORAL
  Filled 2018-01-07: qty 2

## 2018-01-07 NOTE — Discharge Instructions (Signed)
Acute Pancreatitis  Acute pancreatitis is a condition in which the pancreas suddenly becomes irritated and swollen (has inflammation). The pancreas is a gland that is located behind the stomach. It produces enzymes that help to digest food. The pancreas also releases the hormones glucagon and insulin, which help to regulate blood sugar. Damage to the pancreas occurs when the digestive enzymes from the pancreas are activated before they are released into the intestine.  Most acute attacks last a couple of days and can cause serious problems. Some people become dehydrated and develop low blood pressure. In severe cases, bleeding into the pancreas can lead to shock and can be life-threatening. The lungs, heart, and kidneys may fail.  What are the causes?  The most common causes of this condition are:  · Alcohol abuse.  · Gallstones.    Other causes include:  · Certain medicines.  · Exposure to certain chemicals.  · Infection.  · Damage caused by an accident (trauma).  · Abdominal surgery.    In some cases, the cause may not be known.  What are the signs or symptoms?  Symptoms of this condition include:  · Pain in the upper abdomen that may radiate to the back.  · Tenderness and swelling of the abdomen.  · Nausea and vomiting.    How is this diagnosed?  This condition may be diagnosed based on:  · A physical exam.  · Blood tests.  · Imaging tests, such as X-rays, CT scans, or an ultrasound of the abdomen.    How is this treated?  Treatment for this condition usually requires a stay in the hospital. Treatment may include:  · Pain medicine.  · Fluid replacement through an IV tube.  · Placing a tube in the stomach to remove stomach contents and to control vomiting (NG tube, or nasogastric tube).  · Not eating for 3-4 days. This gives the pancreas a rest, because enzymes are not being produced that can cause further damage.  · Antibiotic medicines, if your condition is caused by an infection.  · Surgery on the pancreas or  gallbladder.    Follow these instructions at home:  Eating and drinking  · Follow instructions from your health care provider about diet. This may involve avoiding alcohol and decreasing the amount of fat in your diet.  · Eat smaller, more frequent meals. This reduces the amount of digestive fluids that the pancreas produces.  · Drink enough fluid to keep your urine clear or pale yellow.  · Do not drink alcohol if it caused your condition.  General instructions  · Take over-the-counter and prescription medicines only as told by your health care provider.  · Do not use any tobacco products, such as cigarettes, chewing tobacco, and e-cigarettes. If you need help quitting, ask your health care provider.  · Get plenty of rest.  · If directed, check your blood sugar at home as told by your health care provider.  · Keep all follow-up visits as told by your health care provider. This is important.  Contact a health care provider if:  · You do not recover as quickly as expected.  · You develop new or worsening symptoms.  · You have persistent pain, weakness, or nausea.  · You recover and then have another episode of pain.  · You have a fever.  Get help right away if:  · You cannot eat or keep fluids down.  · Your pain becomes severe.  · Your skin or the   white part of your eyes turns yellow (jaundice).  · You vomit.  · You feel dizzy or you faint.  · Your blood sugar is high (over 300 mg/dL).  This information is not intended to replace advice given to you by your health care provider. Make sure you discuss any questions you have with your health care provider.  Document Released: 12/14/2005 Document Revised: 04/22/2016 Document Reviewed: 09/17/2015  [employer] Interactive Patient Education © 2018 [employer]

## 2018-01-07 NOTE — Telephone Encounter (Signed)
See below crm.  Is it ok to schedule Dad's name Mark Lucero dob [birth year].  He is a pt of yours last appointment 12/17   Copied from CRM [phone]. Topic: Appointment Scheduling - Scheduling Inquiry for Clinic >> Jan 07, 2018  8:37 AM Diana EvesHoyt, Maryann B wrote: Reason for CRM: pts father calling in wanting to know if Dr. Para Marchuncan would take his son on as a new pt. I advised him Dr. Para Marchuncan wasn't taking new pts at this time and he just wanted to see if he would make an exception.   >> Jan 07, 2018  8:40 AM Diana EvesHoyt, Maryann B wrote: pts father stating he is getting discharged from the hospital today and is needing to set up primary care

## 2018-01-07 NOTE — Discharge Summary (Addendum)
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 in the last 72 hours. Urinalysis No results found for: COLORURINE, APPEARANCEUR, LABSPEC, PHURINE, GLUCOSEU, HGBUR, BILIRUBINUR, KETONESUR, PROTEINUR, UROBILINOGEN, NITRITE, LEUKOCYTESUR Sepsis Labs Invalid input(s): PROCALCITONIN,  WBC,  LACTICIDVEN   Time coordinating discharge: Over 30 minutes  SIGNED:   Renae Fickle, MD  [facility] 01/07/2018, 4:52 PM Pager   If 7PM-7AM, please contact night-coverage [email or web address] Password [email or web address]

## 2018-01-07 NOTE — Progress Notes (Signed)
This CM met with pt and father at bedside for DC planning. Pt does not have a PCP or insurance. Father states that they have made a follow up appointment with [facility] in February. This CM gave him [facility] packet and encouraged him to make an appointment there if he is able to get in there sooner. He states that they will do that. Pt appreciative of CM assistance. Marney Doctor RN,BSN,NCM [phone]

## 2018-01-07 NOTE — Progress Notes (Signed)
Pt discharged home with all belongings. Discharge education completed.

## 2018-01-09 NOTE — Telephone Encounter (Signed)
30 min when possible.  Thanks.

## 2018-01-11 NOTE — Telephone Encounter (Signed)
Left message 1/14 with dad to have pt to call to schedule appointment put in crm

## 2018-01-12 NOTE — Telephone Encounter (Signed)
Left message asking pt to call office  °

## 2019-10-23 IMAGING — US US ABDOMEN COMPLETE
1 series · 13 of 25 positions shown · non-contrast
Comparison: Chest CTA - 12/31/2017; CT abdomen pelvis -12/23/2017

CLINICAL DATA: Pancreatic abscess.

EXAM:
ABDOMEN ULTRASOUND COMPLETE

[Series 1: us abdomen complete · 0.26mm/px · 13 of 119 slices shown]
[im 1/119]
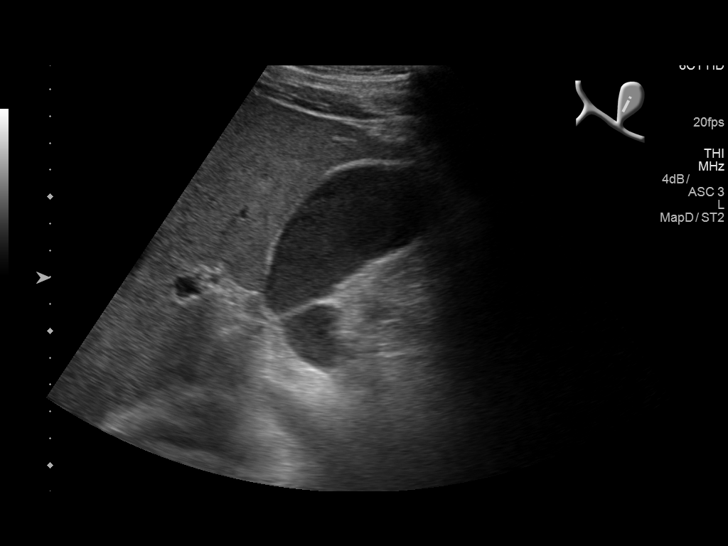
[im 10/119]
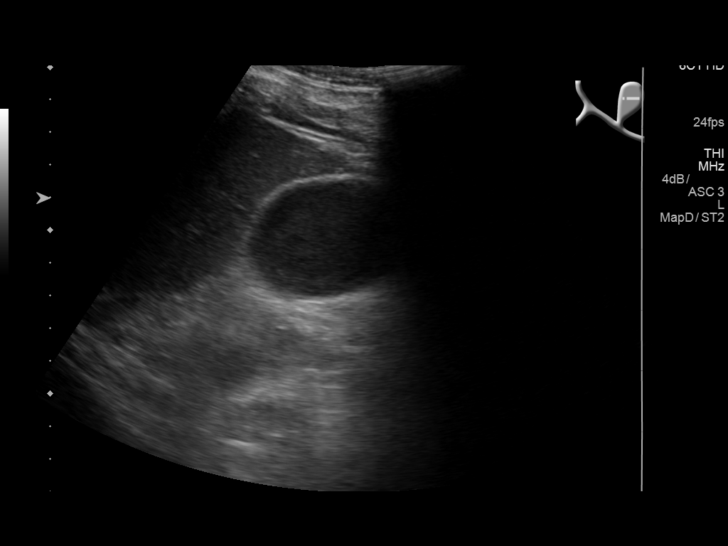
[im 20/119]
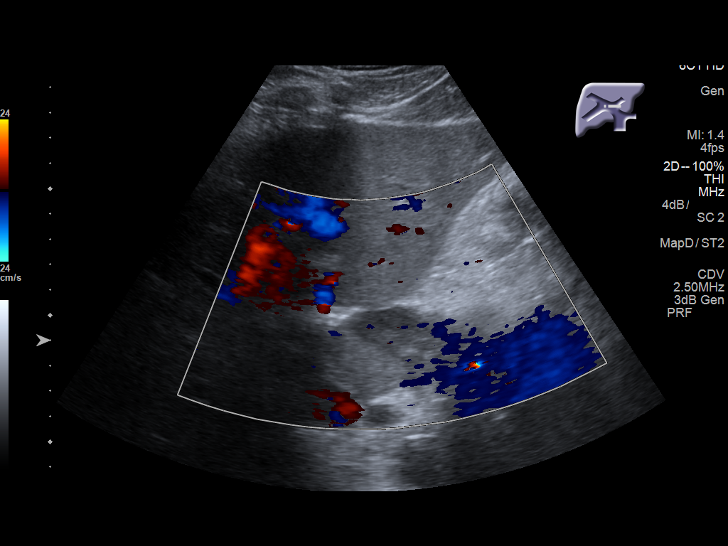
[im 30/119]
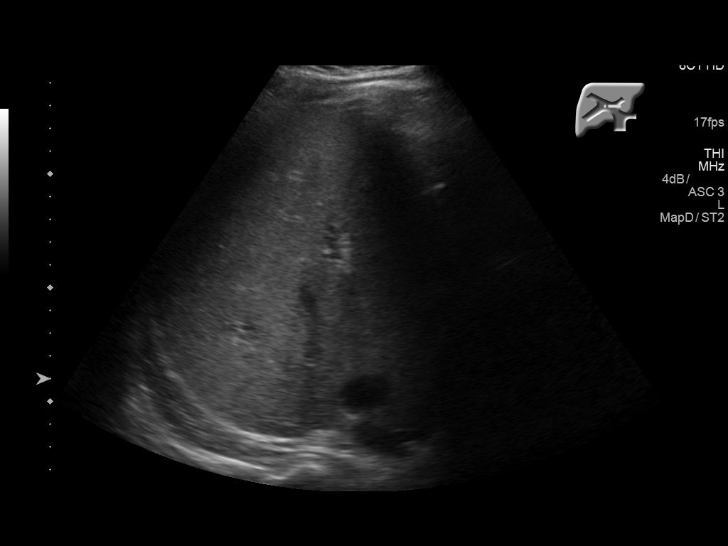
[im 40/119]
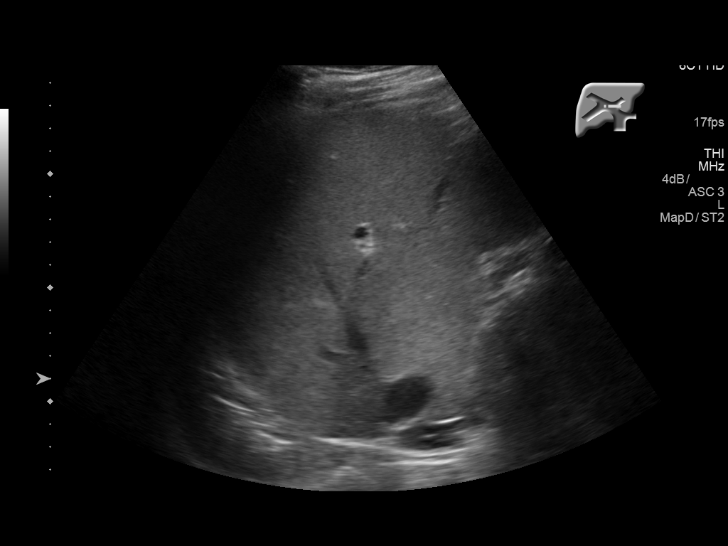
[im 50/119]
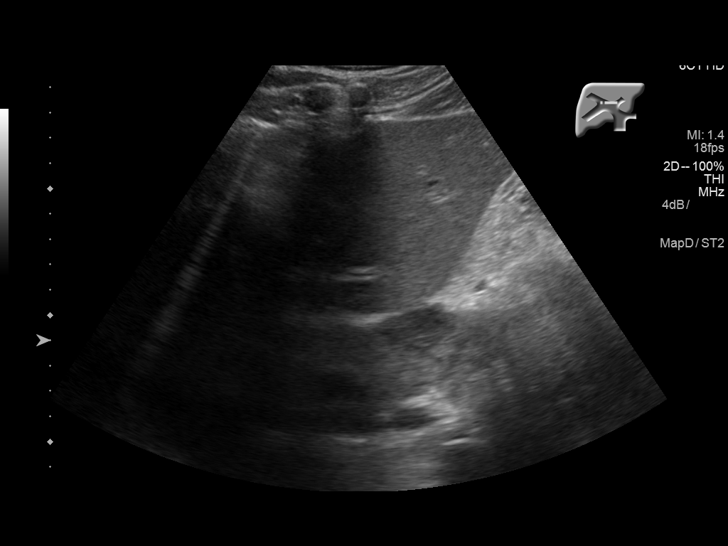
[im 60/119]
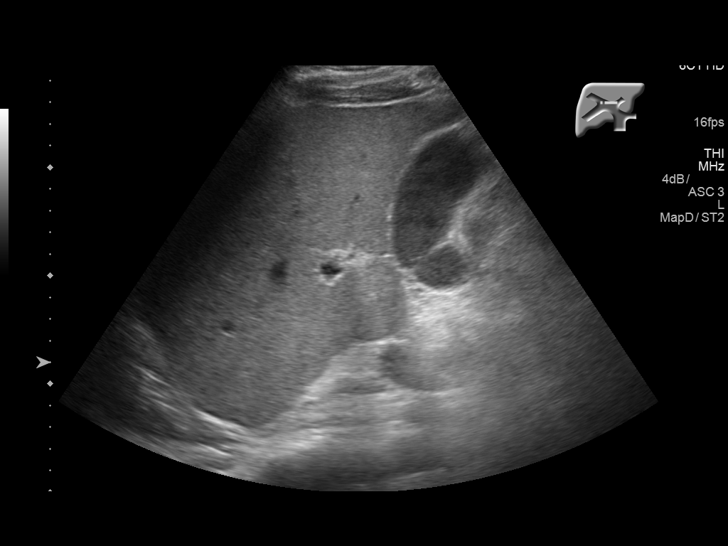
[im 69/119]
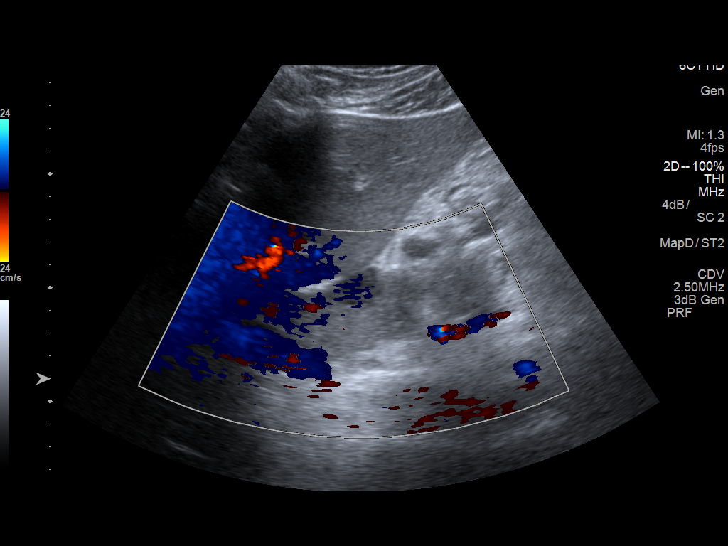
[im 79/119]
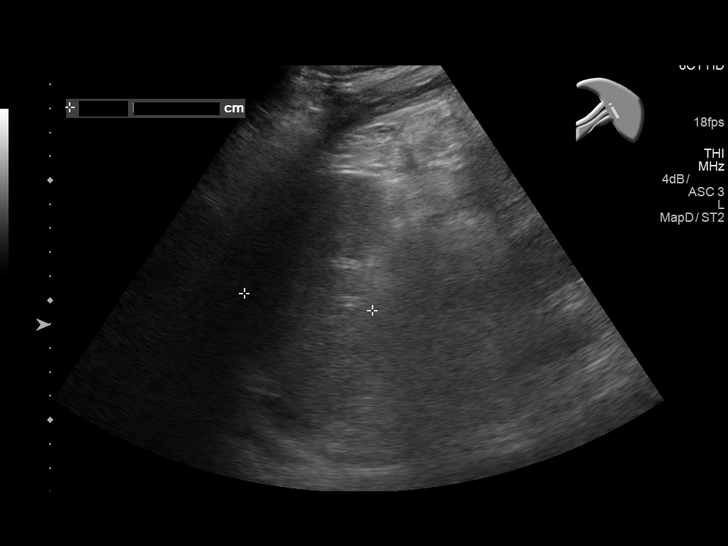
[im 89/119]
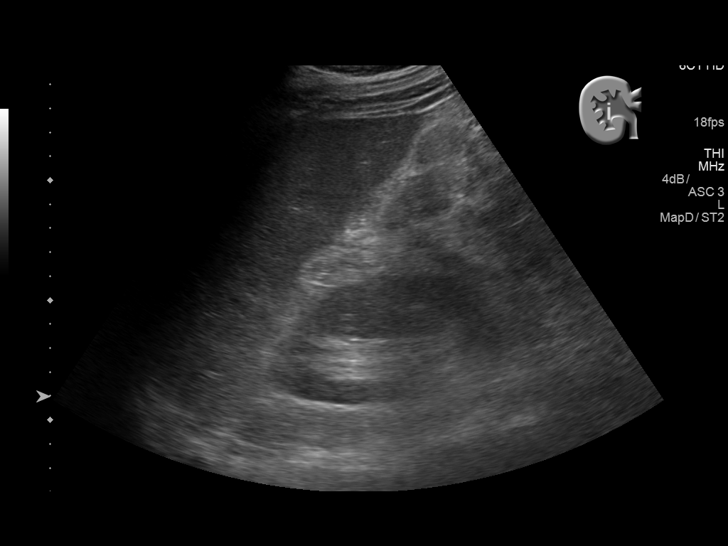
[im 99/119]
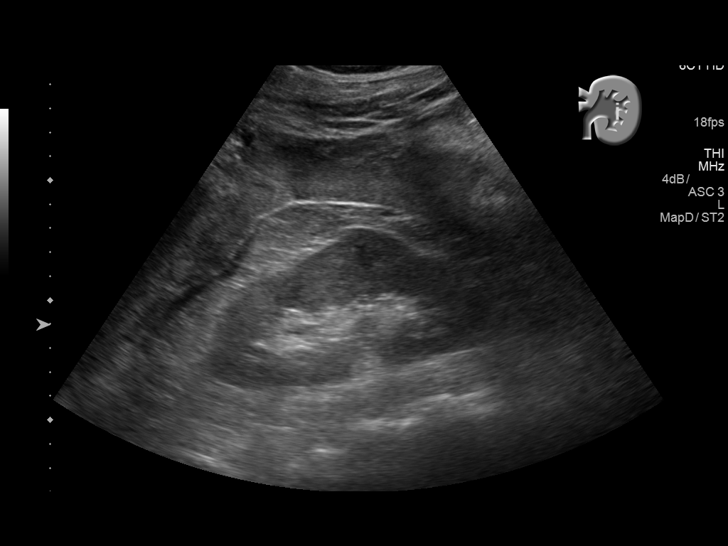
[im 109/119]
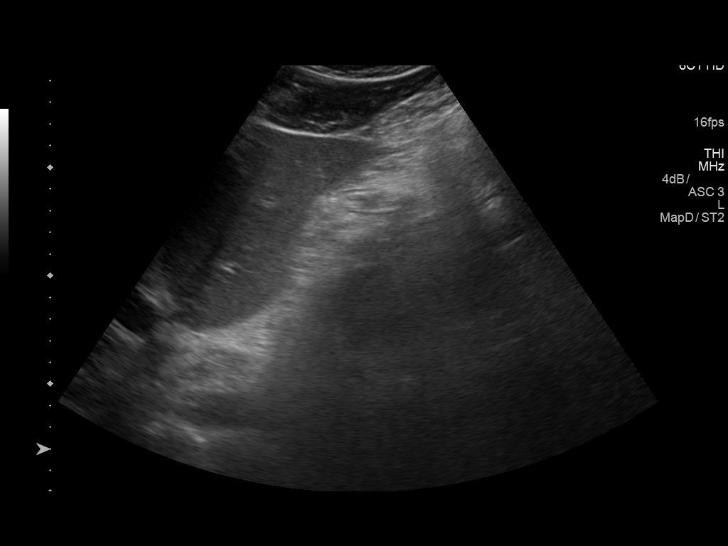
[im 119/119]
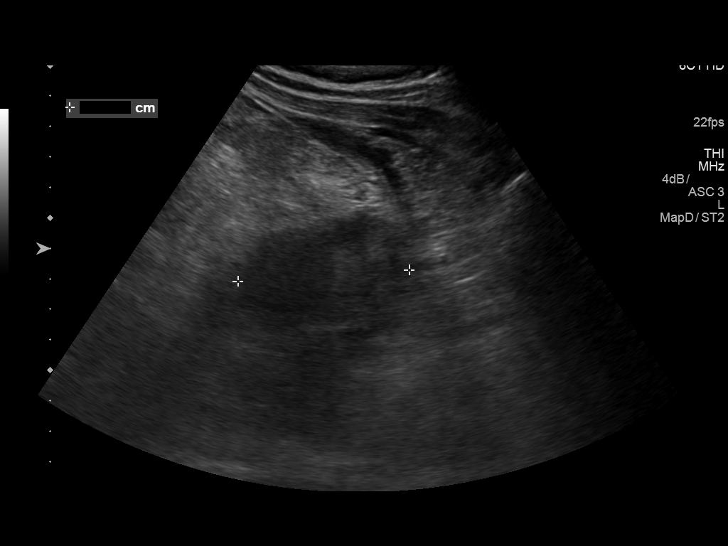

[13 of 25 positions shown; findings below may reference images not displayed]

FINDINGS: Gallbladder: Echogenic debris/sludge is seen within otherwise
normal-appearing gallbladder. No gallbladder wall thickening or
pericholecystic fluid. Negative sonographic Murphy's sign.

Common bile duct:  Not visualized

Liver: No discrete hepatic lesions. No intrahepatic biliary duct
dilatation. No ascites. Portal vein is patent on color Doppler
imaging with normal direction of blood flow towards the liver.

IVC: No abnormality visualized.

Pancreas: There is an approximately 3.1 x 1.9 cm fluid collection
adjacent to the caudate lobe of the liver (images 10 -18) as well as
an approximately 3.2 x 3.8 x 3.3 cm fluid collection regional to the
pancreatic head (images 40 - 48), both of which appear similar to
preceding abdominal CT. The remainder of the pancreas is not well
visualized.

Spleen: Normal in size measuring 5.4 cm in length

Right Kidney: Normal cortical thickness, echogenicity and size,
measuring 10.8 cm in length. No focal renal lesions. No echogenic
renal stones. No urinary obstruction.

Left Kidney: Normal cortical thickness, echogenicity and size,
measuring 12.4 cm in length. No focal renal lesions. No echogenic
renal stones. No urinary obstruction.

Abdominal aorta: No aneurysm visualized.

Other findings: Note is made of a small bilateral pleural effusions
(right- image 22; left - image 50).

Note is made of a serpiginous ill-defined approximately 14.6 x 3.9 x
5.6 cm fluid collection tracking along the left pericolic gutter
(images 83 through 90), also similar to preceding abdominal CT.
IMPRESSION: 1. Peripancreatic and left pericolic gutter fluid collections appear
similar to chest CT performed 12/31/2017 and again are worrisome for
advanced/severe pancreatitis. Further evaluation with dedicated
contrast-enhanced CT of the abdomen and pelvis could be performed as
indicated.
2. Echogenic debris/sludge within otherwise normal-appearing
gallbladder.
3. Trace bilateral pleural effusions, right greater than left.

## 2019-11-09 IMAGING — DX DG CHEST 1V PORT
2 series · 2 of 2 positions shown · non-contrast
Comparison: Portable chest x-ray December 26, 2017

CLINICAL DATA: Respiratory failure

EXAM:
PORTABLE CHEST 1 VIEW

[chest ap (1 of 2)]
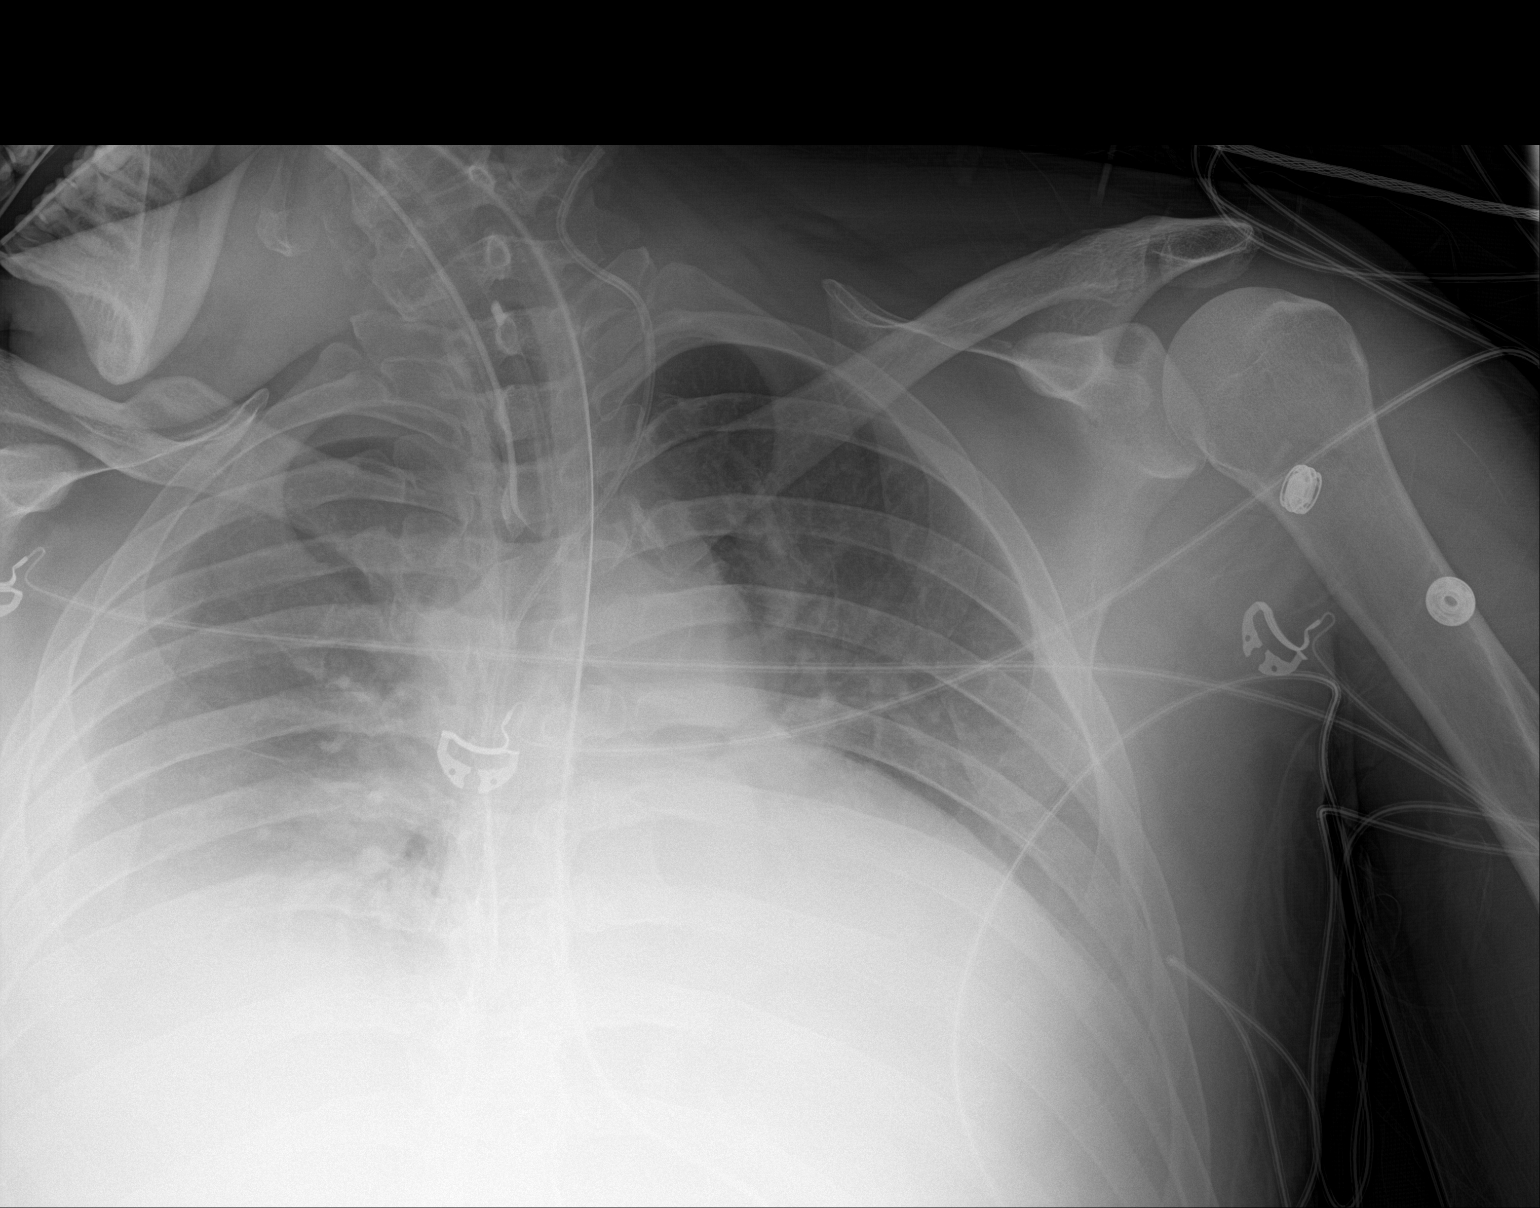

[chest ap (2 of 2)]
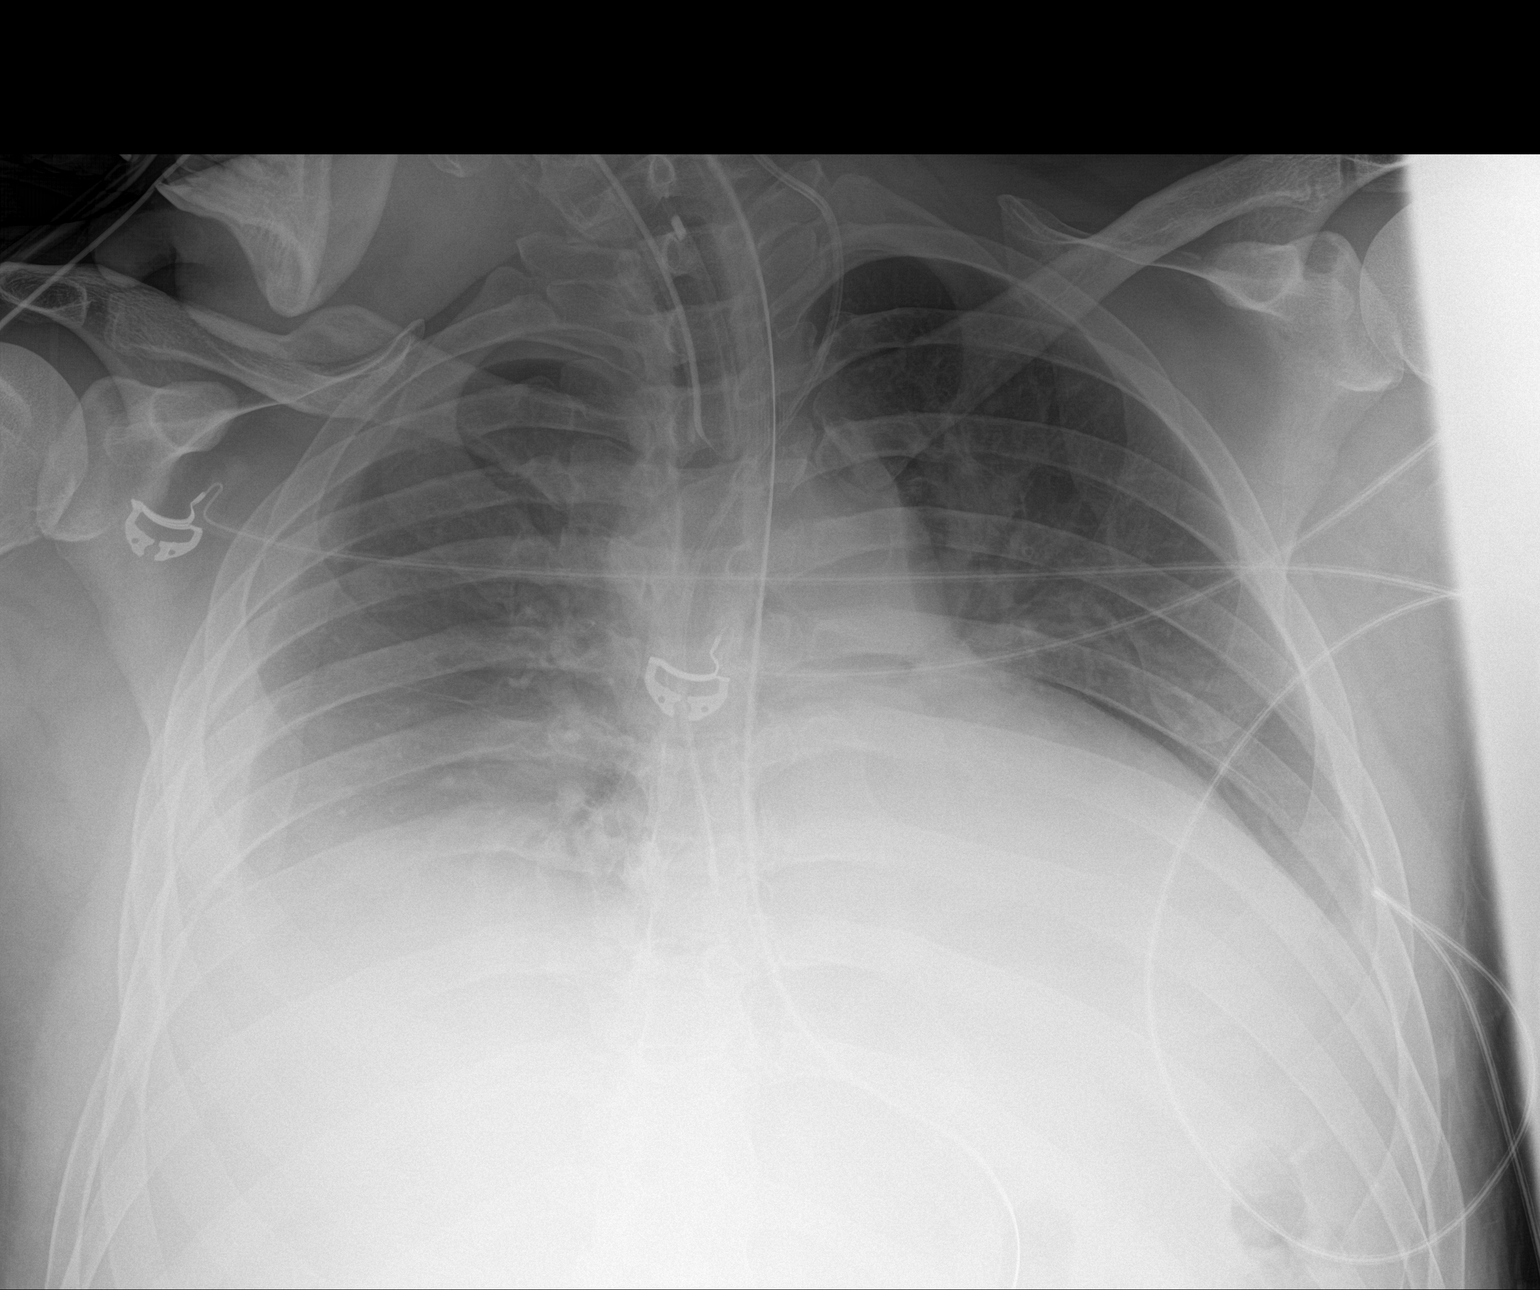

[2 of 2 positions shown; findings below may reference images not displayed]

FINDINGS: The lungs are mildly hypoinflated. There is a smaller moderate-sized
pleural effusion layering posteriorly and laterally on the right.
There is density at both lung bases compatible with atelectasis. The
heart is top-normal in size. The pulmonary vascularity is mildly
prominent centrally. The endotracheal tube tip projects
approximately 3 cm above the carina. The esophagogastric tube tip in
proximal port project below the inferior margin of the image. The
left internal jugular venous catheter tip projects at the cavoatrial
junction.
IMPRESSION: Fairly stable appearance of the chest. Persistent bibasilar
atelectasis or pneumonia with small right pleural effusion. Possible
low-grade CHF.

The support tubes are in reasonable position.

## 2019-11-15 IMAGING — DX DG CHEST 1V PORT
1 series · 1 of 1 positions shown · non-contrast
Comparison: 12/31/2017

CLINICAL DATA: Respiratory failure

EXAM:
PORTABLE CHEST 1 VIEW

[chest ap]
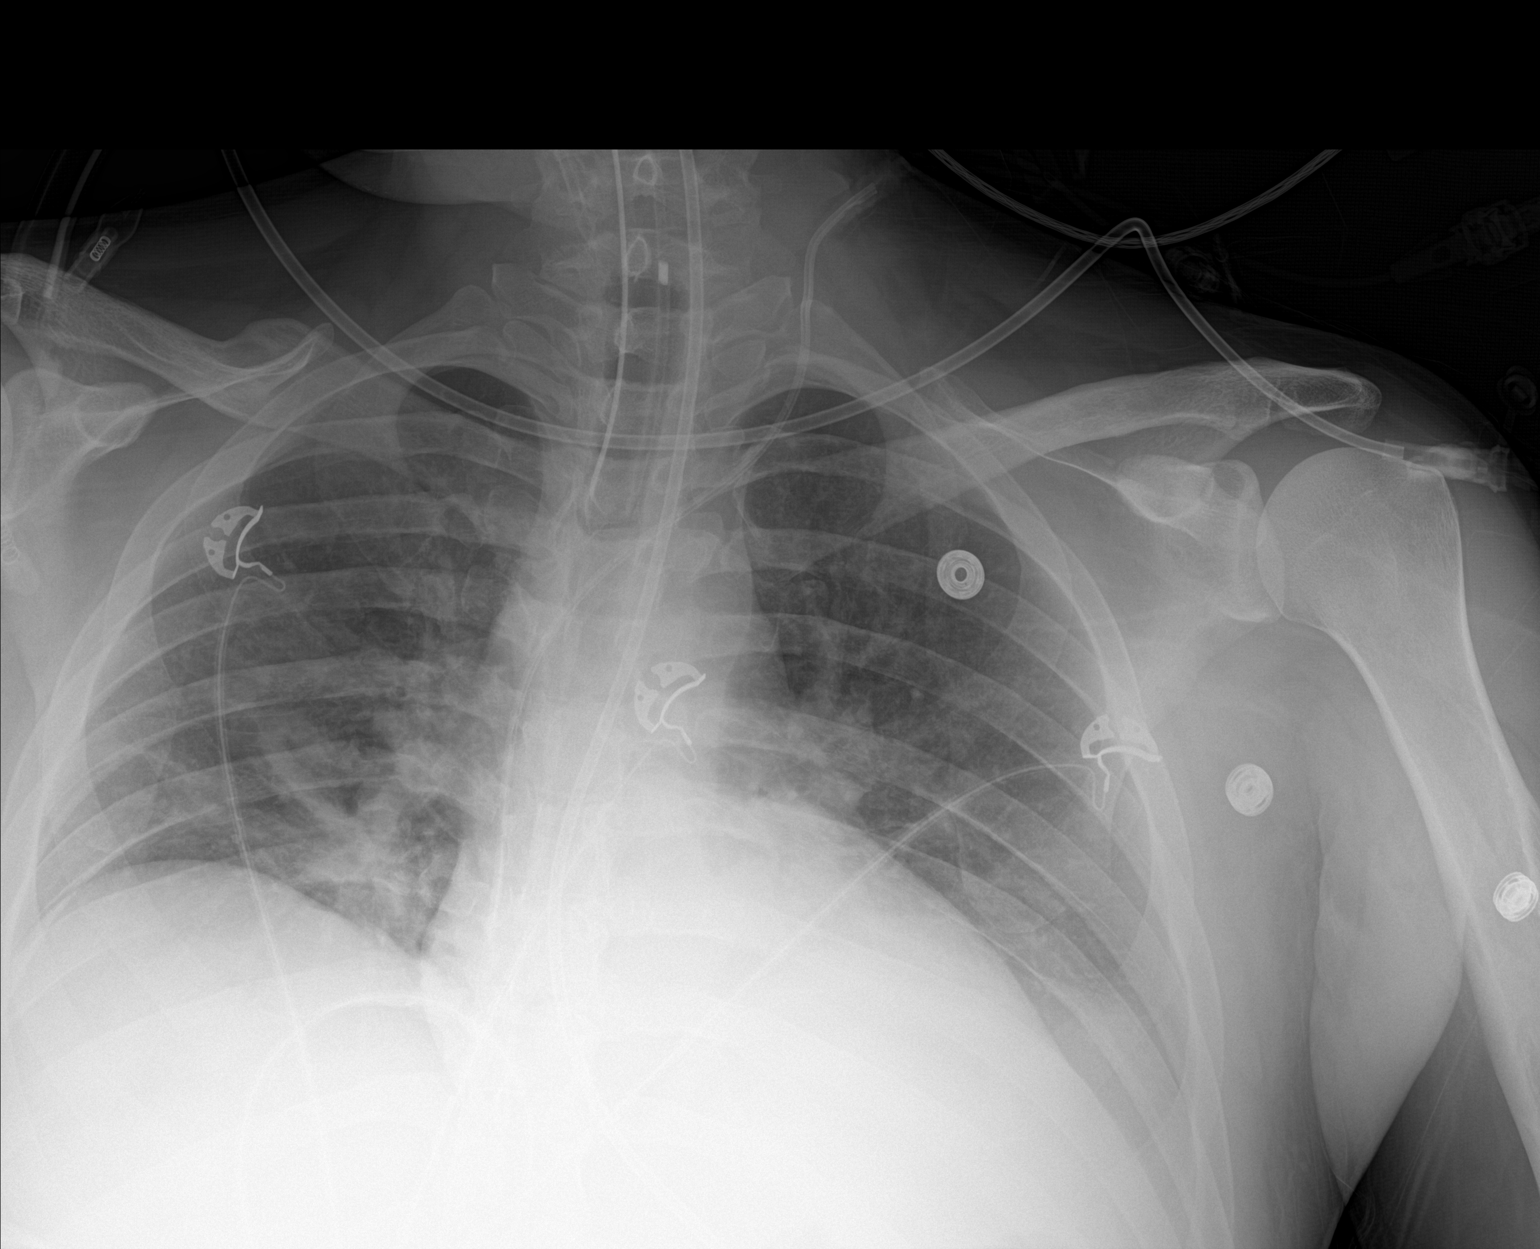

[1 of 1 positions shown; findings below may reference images not displayed]

FINDINGS: This is a low volume film.

Cardiomediastinal silhouette is unchanged.

An endotracheal tube with tip 5 cm above the carina, left IJ central
venous catheter with tip overlying the superior cavoatrial junction,
and small bore feeding tube entering the stomach with tip off the
field of view again noted.

Right basilar atelectasis, left basilar consolidations/ atelectasis
and pulmonary vascular congestion again noted.

There is no evidence of pneumothorax.
IMPRESSION: No significant change.
